# Patient Record
Sex: Male | Born: 1940 | Race: Black or African American | Hispanic: No | Marital: Married | Smoking: Current every day smoker
Health system: Southern US, Community
[De-identification: ages and names within clinical notes are randomized; demographics above are authoritative.]

## PROBLEM LIST (undated history)

## (undated) DIAGNOSIS — I509 Heart failure, unspecified: Secondary | ICD-10-CM

## (undated) DIAGNOSIS — I10 Essential (primary) hypertension: Secondary | ICD-10-CM

## (undated) DIAGNOSIS — I2510 Atherosclerotic heart disease of native coronary artery without angina pectoris: Secondary | ICD-10-CM

## (undated) DIAGNOSIS — N289 Disorder of kidney and ureter, unspecified: Secondary | ICD-10-CM

## (undated) HISTORY — PX: HEART TRANSPLANT: SHX268

---

## 2004-10-09 ENCOUNTER — Inpatient Hospital Stay (HOSPITAL_COMMUNITY): Admission: EM | Admit: 2004-10-09 | Discharge: 2004-10-16 | Payer: Self-pay | Admitting: Unknown Physician Specialty

## 2004-10-09 ENCOUNTER — Ambulatory Visit: Payer: Self-pay | Admitting: Internal Medicine

## 2004-10-09 ENCOUNTER — Ambulatory Visit: Payer: Self-pay | Admitting: Gastroenterology

## 2004-10-12 ENCOUNTER — Encounter (INDEPENDENT_AMBULATORY_CARE_PROVIDER_SITE_OTHER): Payer: Self-pay | Admitting: Cardiology

## 2005-05-16 ENCOUNTER — Encounter (HOSPITAL_COMMUNITY): Admission: RE | Admit: 2005-05-16 | Discharge: 2005-08-14 | Payer: Self-pay | Admitting: Cardiology

## 2005-08-15 ENCOUNTER — Encounter (HOSPITAL_COMMUNITY): Admission: RE | Admit: 2005-08-15 | Discharge: 2005-09-20 | Payer: Self-pay | Admitting: Cardiology

## 2007-05-08 ENCOUNTER — Emergency Department (HOSPITAL_COMMUNITY): Admission: EM | Admit: 2007-05-08 | Discharge: 2007-05-08 | Payer: Self-pay | Admitting: Emergency Medicine

## 2008-06-03 ENCOUNTER — Emergency Department (HOSPITAL_COMMUNITY): Admission: EM | Admit: 2008-06-03 | Discharge: 2008-06-03 | Payer: Self-pay | Admitting: Emergency Medicine

## 2010-05-31 ENCOUNTER — Encounter: Admission: RE | Admit: 2010-05-31 | Discharge: 2010-05-31 | Payer: Self-pay | Admitting: Nephrology

## 2010-06-06 ENCOUNTER — Encounter (HOSPITAL_COMMUNITY): Admission: RE | Admit: 2010-06-06 | Discharge: 2010-06-14 | Payer: Self-pay | Admitting: Nephrology

## 2010-06-20 ENCOUNTER — Encounter
Admission: RE | Admit: 2010-06-20 | Discharge: 2010-09-18 | Payer: Self-pay | Source: Home / Self Care | Attending: Nephrology | Admitting: Nephrology

## 2010-09-19 ENCOUNTER — Encounter (HOSPITAL_COMMUNITY)
Admission: RE | Admit: 2010-09-19 | Discharge: 2010-10-23 | Payer: Self-pay | Source: Home / Self Care | Attending: Nephrology | Admitting: Nephrology

## 2010-10-08 LAB — IRON AND TIBC
Iron: 112 ug/dL (ref 42–135)
Saturation Ratios: 48 % (ref 20–55)
TIBC: 234 ug/dL (ref 215–435)
UIBC: 122 ug/dL

## 2010-10-08 LAB — RENAL FUNCTION PANEL
Albumin: 3.3 g/dL — ABNORMAL LOW (ref 3.5–5.2)
BUN: 27 mg/dL — ABNORMAL HIGH (ref 6–23)
CO2: 23 mEq/L (ref 19–32)
Calcium: 9.8 mg/dL (ref 8.4–10.5)
Chloride: 105 mEq/L (ref 96–112)
Creatinine, Ser: 2.24 mg/dL — ABNORMAL HIGH (ref 0.4–1.5)
GFR calc Af Amer: 35 mL/min — ABNORMAL LOW (ref >60)
GFR calc non Af Amer: 29 mL/min — ABNORMAL LOW (ref >60)
Glucose, Bld: 96 mg/dL (ref 70–99)
Phosphorus: 2.9 mg/dL (ref 2.3–4.6)
Potassium: 4.7 mEq/L (ref 3.5–5.1)
Sodium: 137 mEq/L (ref 135–145)

## 2010-10-08 LAB — POCT HEMOGLOBIN-HEMACUE: Hemoglobin: 10.8 g/dL — ABNORMAL LOW (ref 13.0–17.0)

## 2010-10-08 LAB — FERRITIN: Ferritin: 1364 ng/mL — ABNORMAL HIGH (ref 22–322)

## 2010-10-18 LAB — POCT HEMOGLOBIN-HEMACUE: Hemoglobin: 10.8 g/dL — ABNORMAL LOW (ref 13.0–17.0)

## 2010-10-31 ENCOUNTER — Other Ambulatory Visit: Payer: Self-pay | Admitting: Nephrology

## 2010-10-31 ENCOUNTER — Encounter (HOSPITAL_COMMUNITY): Payer: Medicare Other | Attending: Nephrology

## 2010-10-31 ENCOUNTER — Other Ambulatory Visit: Payer: Self-pay

## 2010-10-31 DIAGNOSIS — N183 Chronic kidney disease, stage 3 unspecified: Secondary | ICD-10-CM | POA: Insufficient documentation

## 2010-10-31 DIAGNOSIS — D638 Anemia in other chronic diseases classified elsewhere: Secondary | ICD-10-CM | POA: Insufficient documentation

## 2010-10-31 LAB — RENAL FUNCTION PANEL
Albumin: 3.7 g/dL (ref 3.5–5.2)
BUN: 29 mg/dL — ABNORMAL HIGH (ref 6–23)
CO2: 24 mEq/L (ref 19–32)
Calcium: 10.2 mg/dL (ref 8.4–10.5)
Chloride: 105 mEq/L (ref 96–112)
Creatinine, Ser: 2.45 mg/dL — ABNORMAL HIGH (ref 0.4–1.5)
GFR calc Af Amer: 32 mL/min — ABNORMAL LOW (ref >60)
GFR calc non Af Amer: 26 mL/min — ABNORMAL LOW (ref >60)
Glucose, Bld: 98 mg/dL (ref 70–99)
Phosphorus: 3.8 mg/dL (ref 2.3–4.6)
Potassium: 4.9 mEq/L (ref 3.5–5.1)
Sodium: 137 mEq/L (ref 135–145)

## 2010-11-01 LAB — POCT HEMOGLOBIN-HEMACUE: Hemoglobin: 11.8 g/dL — ABNORMAL LOW (ref 13.0–17.0)

## 2010-11-14 ENCOUNTER — Other Ambulatory Visit: Payer: Self-pay | Admitting: Nephrology

## 2010-11-14 ENCOUNTER — Other Ambulatory Visit: Payer: Self-pay

## 2010-11-14 ENCOUNTER — Encounter (HOSPITAL_COMMUNITY): Payer: Medicare Other

## 2010-11-14 LAB — IRON AND TIBC
Iron: 42 ug/dL (ref 42–135)
Saturation Ratios: 22 % (ref 20–55)
TIBC: 187 ug/dL — ABNORMAL LOW (ref 215–435)
UIBC: 145 ug/dL

## 2010-11-14 LAB — FERRITIN: Ferritin: 1686 ng/mL — ABNORMAL HIGH (ref 22–322)

## 2010-11-15 LAB — POCT HEMOGLOBIN-HEMACUE: Hemoglobin: 10.9 g/dL — ABNORMAL LOW (ref 13.0–17.0)

## 2010-11-28 ENCOUNTER — Encounter (HOSPITAL_COMMUNITY): Payer: Medicare Other | Attending: Nephrology

## 2010-11-28 ENCOUNTER — Other Ambulatory Visit: Payer: Self-pay

## 2010-11-28 ENCOUNTER — Other Ambulatory Visit: Payer: Self-pay | Admitting: Nephrology

## 2010-11-28 DIAGNOSIS — D638 Anemia in other chronic diseases classified elsewhere: Secondary | ICD-10-CM | POA: Insufficient documentation

## 2010-11-28 DIAGNOSIS — N183 Chronic kidney disease, stage 3 unspecified: Secondary | ICD-10-CM | POA: Insufficient documentation

## 2010-11-28 LAB — RENAL FUNCTION PANEL
Albumin: 3.5 g/dL (ref 3.5–5.2)
BUN: 24 mg/dL — ABNORMAL HIGH (ref 6–23)
CO2: 22 mEq/L (ref 19–32)
Calcium: 9.4 mg/dL (ref 8.4–10.5)
Chloride: 105 mEq/L (ref 96–112)
Creatinine, Ser: 2.17 mg/dL — ABNORMAL HIGH (ref 0.4–1.5)
GFR calc Af Amer: 36 mL/min — ABNORMAL LOW (ref >60)
GFR calc non Af Amer: 30 mL/min — ABNORMAL LOW (ref >60)
Glucose, Bld: 122 mg/dL — ABNORMAL HIGH (ref 70–99)
Phosphorus: 3.2 mg/dL (ref 2.3–4.6)
Potassium: 4 mEq/L (ref 3.5–5.1)
Sodium: 133 mEq/L — ABNORMAL LOW (ref 135–145)

## 2010-12-03 LAB — RENAL FUNCTION PANEL
BUN: 28 mg/dL — ABNORMAL HIGH (ref 6–23)
Calcium: 10.1 mg/dL (ref 8.4–10.5)
Glucose, Bld: 143 mg/dL — ABNORMAL HIGH (ref 70–99)
Phosphorus: 3.6 mg/dL (ref 2.3–4.6)
Potassium: 4.9 mEq/L (ref 3.5–5.1)

## 2010-12-03 LAB — POCT HEMOGLOBIN-HEMACUE
Hemoglobin: 12.2 g/dL — ABNORMAL LOW (ref 13.0–17.0)
Hemoglobin: 12 g/dL — ABNORMAL LOW (ref 13.0–17.0)

## 2010-12-03 LAB — IRON AND TIBC
Iron: 36 ug/dL — ABNORMAL LOW (ref 42–135)
Saturation Ratios: 17 % — ABNORMAL LOW (ref 20–55)
UIBC: 174 ug/dL

## 2010-12-04 LAB — POCT HEMOGLOBIN-HEMACUE
Hemoglobin: 10.3 g/dL — ABNORMAL LOW (ref 13.0–17.0)
Hemoglobin: 12 g/dL — ABNORMAL LOW (ref 13.0–17.0)
Hemoglobin: 12.6 g/dL — ABNORMAL LOW (ref 13.0–17.0)

## 2010-12-05 LAB — POCT HEMOGLOBIN-HEMACUE
Hemoglobin: 9.3 g/dL — ABNORMAL LOW (ref 13.0–17.0)
Hemoglobin: 9.8 g/dL — ABNORMAL LOW (ref 13.0–17.0)

## 2010-12-06 LAB — POCT HEMOGLOBIN-HEMACUE: Hemoglobin: 3.7 g/dL — CL (ref 13.0–17.0)

## 2010-12-12 ENCOUNTER — Other Ambulatory Visit: Payer: Self-pay | Admitting: Nephrology

## 2010-12-12 ENCOUNTER — Encounter (HOSPITAL_COMMUNITY)
Admission: RE | Admit: 2010-12-12 | Discharge: 2010-12-12 | Payer: Medicare Other | Source: Ambulatory Visit | Attending: Nephrology | Admitting: Nephrology

## 2010-12-12 LAB — IRON AND TIBC
Iron: 46 ug/dL (ref 42–135)
Saturation Ratios: 25 % (ref 20–55)
TIBC: 181 ug/dL — ABNORMAL LOW (ref 215–435)
UIBC: 135 ug/dL

## 2010-12-26 ENCOUNTER — Encounter (HOSPITAL_COMMUNITY): Payer: Medicare Other | Attending: Nephrology

## 2010-12-26 ENCOUNTER — Other Ambulatory Visit: Payer: Self-pay | Admitting: Nephrology

## 2010-12-26 DIAGNOSIS — D638 Anemia in other chronic diseases classified elsewhere: Secondary | ICD-10-CM | POA: Insufficient documentation

## 2010-12-26 DIAGNOSIS — N183 Chronic kidney disease, stage 3 unspecified: Secondary | ICD-10-CM | POA: Insufficient documentation

## 2010-12-26 LAB — POCT HEMOGLOBIN-HEMACUE: Hemoglobin: 9.5 g/dL — ABNORMAL LOW (ref 13.0–17.0)

## 2011-01-09 ENCOUNTER — Other Ambulatory Visit: Payer: Self-pay | Admitting: Nephrology

## 2011-01-09 ENCOUNTER — Encounter (HOSPITAL_COMMUNITY): Payer: Medicare Other

## 2011-01-09 LAB — RENAL FUNCTION PANEL
BUN: 28 mg/dL — ABNORMAL HIGH (ref 6–23)
CO2: 25 mEq/L (ref 19–32)
Chloride: 103 mEq/L (ref 96–112)
Creatinine, Ser: 2.41 mg/dL — ABNORMAL HIGH (ref 0.4–1.5)
Glucose, Bld: 94 mg/dL (ref 70–99)

## 2011-01-09 LAB — IRON AND TIBC
Iron: 38 ug/dL — ABNORMAL LOW (ref 42–135)
Saturation Ratios: 18 % — ABNORMAL LOW (ref 20–55)
TIBC: 210 ug/dL — ABNORMAL LOW (ref 215–435)
UIBC: 172 ug/dL

## 2011-01-09 LAB — FERRITIN: Ferritin: 1475 ng/mL — ABNORMAL HIGH (ref 22–322)

## 2011-01-23 ENCOUNTER — Encounter (HOSPITAL_COMMUNITY)
Admission: RE | Admit: 2011-01-23 | Discharge: 2011-01-23 | Disposition: A | Discharge status: Home / Self Care | Payer: Medicare Other | Source: Ambulatory Visit | Attending: Nephrology | Admitting: Nephrology

## 2011-01-23 ENCOUNTER — Other Ambulatory Visit: Payer: Self-pay | Admitting: Nephrology

## 2011-01-23 DIAGNOSIS — N183 Chronic kidney disease, stage 3 unspecified: Secondary | ICD-10-CM | POA: Insufficient documentation

## 2011-01-23 DIAGNOSIS — D638 Anemia in other chronic diseases classified elsewhere: Secondary | ICD-10-CM | POA: Insufficient documentation

## 2011-01-23 LAB — POCT HEMOGLOBIN-HEMACUE: Hemoglobin: 9.8 g/dL — ABNORMAL LOW (ref 13.0–17.0)

## 2011-02-06 ENCOUNTER — Encounter (HOSPITAL_COMMUNITY): Payer: Medicare Other

## 2011-02-06 ENCOUNTER — Other Ambulatory Visit: Payer: Self-pay | Admitting: Nephrology

## 2011-02-06 LAB — IRON AND TIBC: UIBC: 152 ug/dL

## 2011-02-06 LAB — RENAL FUNCTION PANEL
BUN: 24 mg/dL — ABNORMAL HIGH (ref 6–23)
CO2: 25 mEq/L (ref 19–32)
Calcium: 9.8 mg/dL (ref 8.4–10.5)
Glucose, Bld: 95 mg/dL (ref 70–99)
Phosphorus: 3.2 mg/dL (ref 2.3–4.6)

## 2011-02-06 LAB — FERRITIN: Ferritin: 1566 ng/mL — ABNORMAL HIGH (ref 22–322)

## 2011-02-08 NOTE — Op Note (Signed)
[name], [name]                 ACCOUNT NO.:  [id number]   MEDICAL RECORD NO.:  [id number]          PATIENT TYPE:  INP   LOCATION:  2316                         FACILITY:  [facility]   PHYSICIAN:  Judie Petit, M.D. DATE OF BIRTH:  05-17-1941   DATE OF PROCEDURE:  10/09/2004  DATE OF DISCHARGE:                                 OPERATIVE REPORT   PROCEDURE:  Intubation.   ANESTHESIA:  Judie Petit, M.D.   The anesthesia team was called to 2316 in the SICU for this 70 year old male  cardiac patient in respiratory distress.  Case was briefly discussed with  Dr. Elsie Lincoln.  On arrival to the room, systolic blood pressure 80s to 90s, O2  saturation low 90s.  The patient was in respiratory distress.   Rapid sequence induction with cricoid pressure was performed.  Succinylcholine 100 mg and etomidate 10 mg were given intravenously via the  femoral line.  A #8 endotracheal tube was placed by CRNA without difficulty.  Positive end-tidal CO2 by Sallyanne Kuster.  Bilateral breath sounds.  The patient  tolerated the procedure okay.  Systolic blood pressure 90s afterwards, and  O2 saturation 98%.  The tube was secured by RT.   PLAN:  1.  Chest x-ray pending.  2.  Respiratory care per the cardiac team.       CE/MEDQ  D:  10/09/2004  T:  10/09/2004  Job:  [id number]

## 2011-02-08 NOTE — Discharge Summary (Signed)
NAMEGARRETT, Boone                 ACCOUNT NO.:  [id number]   MEDICAL RECORD NO.:  [id number]          PATIENT TYPE:  INP   LOCATION:  2316                         FACILITY:  [facility]   PHYSICIAN:  Richard A. Alanda Amass, M.D.DATE OF BIRTH:  January 17, 1941   DATE OF ADMISSION:  10/09/2004  DATE OF DISCHARGE:  10/16/2004                                 DISCHARGE SUMMARY   [name] is a 70 year old African-American, married male patient with  no prior history of coronary artery disease.  He presented to the emergency  room after being seen at Urgent Care with complaints of severe abdominal  pain, bloating, and sweating and a rapid heart rate.  At Urgent Care, he  developed some mild left-sided chest pain.  An EKG was done that revealed  anterior ST elevation, and he was transferred to [facility] Emergency  Department.  Dr. Elsie Lincoln assessed the patient.  He had ST elevation in V2 to  V3 and ST depression and T wave inversion in his inferior leads.  He was  seen by Dr. Elsie Lincoln, and it was decided to take him emergently to the  catheterization lab.  Note:  He had no prior history of coronary artery  disease.  He had been relatively healthy.  He moved down to [place]  about two to three years ago.  He lived in [place].  He did have high blood  pressure but was not on any medication for this.  He had some arthritis of  his left shoulder and pneumonia in the past.   FAMILY HISTORY:  Includes lung cancer.   SOCIAL HISTORY:  He did smoke about one pack per day since age of 65.  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  He was previously drinking two to three  scotch's per week.  He is married with three children and two step children.   He started having severe abdominal pain at about 11 a.m.  He arrived to  Urgent Care about 1:30 p.m.  Her arrived to the emergency room at 2:52 p.m.,  and he arrived in the catheterization lab at 3:10 p.m.  Cardiac  catheterization was performed by Dr.  Elsie Lincoln.  The patient was found to have  three-vessel severe disease.  He was in cardiogenic shock.  It was decided  not to do intervention at that time.  He had a 95% non-occlusive with  thrombus and plaque proximal LAD.  He had a 75% proximal OM-1.  He had a 75%  left circumflex, and he had a 100% totally occluded recanalized OM-2.  He  had a totalled RCA with some left-to-right collaterals.  He had 3 to 4+ MR.  Because of cardiogenic shock, anterior MI, an intra-aortic balloon pump was  placed.  He was also intubated on October 09, 2004.   He only had trivial urine output post cardiac catheterization.  He was given  IV Lasix 200 mg.  An NG tube was also placed.  He did have some emesis, and  they questioned aspiration.  He was given some albumin with a blood pressure  that dropped down to the 40s, and his heart rate was in the 130s which he  responded to.   Post catheterization, he was on IV Integrilin, dopamine which was turned  off, and then he was started on Levophed.  He also had IV heparin infusing.  He was seen by pulmonary critical care service for ventilator management.  He was sedated.  He developed a fever, and blood cultures were drawn.  Throughout his course of hospitalization, multiple blood cultures and urine  cultures were obtained which have all been negative.  On October 10, 2004,  he was started on Zosyn 3.375 mg IV every 6 hours.   CK-MBs peaked at an exceedingly high rate at 15,083, and CK-MB was 1024.  Troponin was greater than 100.  CVTS consult was called on October 10, 2004.  He was seen by Dr. Charlett Lango who thought the patient was not a  candidate for surgery at the present time but thought he would eventually  need bypass grafting if he stabilized and survived his massive event.   On October 11, 2004, hemoglobin was low.  He dropped down to 8.8, hematocrit  24.5; thus he was infused with 2 units of packed red blood cells.  His IABP  was at 1:1, then  started to be weaned on October 12, 2004, and he went from  1:1 to 1:3.  It was decided to continue him on the ventilator secondary to  his continue low pressures and need for volume with IV fluids and need for  IV Levophed.   On October 13, 2004, his IAPB was removed.  He again received 2 units of  packed red blood cells.  On October 13, 2004, they did try to wean the  respirator.  This failed.  His Levophed continued to be titrated for blood  pressure.  His blood pressure off the balloon pump ranged in the 80s  systolic, and diastolic was 40 to 60s.   On October 14, 2004, Integrilin was discontinued.  He then received 2 units  of packed red blood cells.  Ernestine Conrad was discontinued, and on October 15, 2004,  Lanoxin was started.  He continued to limp along without any real progress.  He still needed IV fluids and pressure support with IV Levophed and  intubation.  He did have some bloody emesis and some maroon stools.  Thus,  he had some iced lavage on October 15, 2004.  A GI consult was called on  October 16, 2004, and he underwent EGD.  This showed no active source of  bleeding.  He did have several sites of gastritis thought to be related to  NG tube trauma.  He also had some duodenitis.   He was seen on October 16, 2004, by Dr. Alanda Amass who conferred with Dr.  Elsie Lincoln.  It was decided that, since he was not improving, he may be a  candidate for transfer if he was not able to undergo bypass grafting.  Thus  he was seen by Dr. Dorris Fetch again who thought options would be an IAPB;  however, this was not the optimal option.  He thought that he should  consider left ventricular assist device as a bridge to recovery or  transplant if he was a candidate.  Dr. Dorris Fetch contacted Dr. Lucy Antigua lo  and Dr. Romona Curls at [place]. They were willing to take the patient in transfer.  Thus he will be transferred to [facility] for further treatment.  At the time of dictation,  his IV fluids are IV  Levophed at 35 drops or 18.7  mcg, IV Versed at 10 mg, IV fentanyl at 75 mcg, IV heparin at 11.3 drops.  His settings are PR VC at 12, no PEEP, 600 tidal volume, and 40% O2,.  To  note, he has had good output with maintenance of his renal function.  He has  been putting out anywhere from 1500 to 2000, 3000 mL of urine per day;  however, his I's and O's add up that he is positive for at least 11,000 mL  of fluid since October 09, 2004.  He has received so far 8 units of packed  red blood cells.  He did receive another 2 units on October 16, 2004.  He  has intermittently received some IV Lasix, and he has intermittently  received Lopressor.  He received 1 dose of Altace 2.5 mg through his NG.   LABORATORY DATA AND OTHER STUDIES:  October 16, 2004, hemoglobin 8.8,  hematocrit 26.3, WBC 21.6, platelets 140.  Sodium 141, potassium 4.1,  chloride 111, CO2 24, BUN 20, creatinine 1.2, glucose 132.  On October 10, 2004, AST was 44, ALT 175.  On October 15, 2004, total protein was 5.4.  His  albumin was 2.3.  Calcium was 8.  On October 15, 2004, his AST was 2; his  ALT was 56.  Hemoglobin A1C was 6.6.  CK-MB #1 was 29562, MB 1024.  His  troponin was greater than 100; #2 was 13086, MB 933, troponin greater than  100; #3 10742, MB 356, troponin greater than 100; #4 4838, MB 87, troponin  greater than 100.  October 09, 2004, total cholesterol was 208.  His HDL was  35.  His LDL was 152.  TSH was 2.413 on October 09, 2004.  On October 11, 2004, digoxin level was 0.8.  On October 15, 2004, digoxin level was 0.4.  Cortisol level was 34.1 on January 18, 206.  He had multiple blood cultures.  On October 10, 2004; on October 11, 2004; and on October 14, 2004, which  were all negative for any growth.  October 09, 2004, he had a urine culture.  On October 10, 2004, he had a urine culture, and on October 12, 2004, he had  a urine culture.  These were all negative also.  He had a sputum culture  that was not  pathogenic, oropharyngeal type flora.   Chest x-ray were also worse.  Chest x-rays were also done multiple times,  the last one being October 16, 2004, which showed further worsening of air  space pulmonary edema.   DISCHARGE DIAGNOSES:  1.  Acute anterior myocardial infarction complicated by prolonged      cardiogenic shock.  2.  Ventilator-dependent respiratory failure secondary to cardiogenic shock.  3.  Blood loss anemia.  Received multiple transfusions.  Underwent      esophagogastroduodenoscopy  on October 16, 2004, showing no acute areas      of bleed.  He did have areas of gastritis and duodenitis.  Gastritis was      thought to be due to nasogastric tube trauma.  4.  Fever.  No source of bacteria found.  He has been on Zosyn intravenously      since October 10, 2004, started at 3.375 mg every 6 hours, increased to      4.5 g every 6 hours.  5.  Three to four plus myocardial infarction per cardiac catheterization.  He did undergo echocardiogram on October 12, 2004, showing a normal      mitral valve structure.  There was mild mitral valve regurgitation.  6.  Ischemic cardiomyopathy with ejection fraction between 15 and 20% with      diffuse hypokinesis of the mid to distal anterior wall, akinesis of the      mid to distal anterolateral wall and periapical wall.  7.  Hypoglycemia, being given intermittent insulin.  8.  Hyperlipidemia with an LDL of 152 on admission.  9.  Questionable aspiration pneumonia.  10. Worsening pulmonary edema by chest x-ray on October 16, 2004.  11. Probable hypertension as an outpatient, untreated.  12. Osteoarthritis.   SCHEDULED MEDICATIONS:  1.  Xopenex 0.63 mg hand-held nebulizer every 6 hours.  2.  Atrovent 0.5 mg every 6 hours.  3.  Chlorhexidine liquid 15 mL every 12 hours.  4.  Lanoxin 0.125 mg every day IV.  5.  Zosyn 4.5 g IV every 6 hours.  6.  Lasix intermittently.  On October 16, 2004, he received 20 mg IV in the      morning and  then 40 mg between packed red blood cells.  7.  Altace 2.5 mg b.i.d. which had not been given except 1 time.  8.  Potassium chloride 40 mEq per tube.  He received that on October 15, 2004.  9.  Lopressor suspension 12.5 mg every 8 hours which he has been receiving      intermittently, last dose given on January 24 at 6 a.m.  10. IV Protonix 40 mg b.i.d.  11. IV Levophed 18 mcg 35 drops.  12. IV Versed at 10 drops.  13. Fentanyl in normal saline 15/10 at 75 mcg.  14. IV Heparin at 11.3 drops.   As far as his mental status is concerned, he has been with moderate to heavy  sedation on October 16, 2004, but in the past when his sedation has been  eased up, he has been appropriate.      BB/MEDQ  D:  10/16/2004  T:  10/16/2004  Job:  [id number]   cc:   Salvatore Decent. Dorris Fetch, M.D.  [address]  [place]  [place] [address]   Madaline Savage, M.D.  [phone] [address], [address]  [place]  [place] [address]  Fax: [phone]   Oley Balm. Sung Amabile, M.D. [facility]   Molly Maduro D. Arlyce Dice, M.D. [facility]   Knox Royalty, M.D.   Floor for transfer to [facility].

## 2011-02-08 NOTE — Cardiovascular Report (Signed)
[name], [name]                 ACCOUNT NO.:  [id number]   MEDICAL RECORD NO.:  [id number]           PATIENT TYPE:   LOCATION:                                 FACILITY:   PHYSICIAN:  Madaline Savage, M.D.     DATE OF BIRTH:   DATE OF PROCEDURE:  10/09/2004  DATE OF DISCHARGE:                              CARDIAC CATHETERIZATION   PATIENT PROFILE:  The patient is a 70 year old African-American married male  who presented to an urgent care center on the day of admission with severe  abdominal pain, bloating, and sweating along with rapid heart rate.  The  patient presented to an urgent care center and Dr. Knox Royalty evaluated  the patient and referred him to the emergency room at [facility] after an EKG  revealed abnormal ST-segment elevation.  The patient had a history arthritis  of the shoulder and an abnormal blood pressure in the past, but never  treated.  He also had a history of pneumonia.  He had been a smoker of one  pack of cigarettes a day since age 22 and he is 70 years old at the time of  presentation.  The patient was examined and assessed and felt to be in need  of emergency cardiac catheterization.  I talked to the patient's wife.  I  examined the patient and then we went to the catheterization laboratory  emergently.  At the time of transfer the patient's blood pressure was in his  80s his heart rate was approximately 122 and appeared to be sinus.   CATHETERIZATION LABORATORY FINDINGS:  Left main coronary artery was  unremarkable.  Left anterior descending coronary artery contained a non-  occlusive stenosis in the proximal LAD with both plaque and non-occlusive  thrombus.  There was a diagonal distal to this vessel which was small as  well as a small distal LAD.   The left circumflex coronary artery gave rise to an obtuse marginal branch  arising very proximally from the LAD that had a 75% stenosis proximally.  There were two other stenoses in the circumflex.  One  was in the ongoing  circumflex as it coursed through the atrioventricular groove very  proximally.  In the proximal to mid portion of this portion of the  circumflex there was a 75% stenosis.  There was a recanalized total  occlusion of an obtuse marginal branch #2.  The RCA was 100% occluded  proximally.  There was some recanalization and some distal flow into the RCA  by way of a pulmonary conus branch into the distal RCA.  There was also  collateralization of the distal RCA from the distal circumflex.  All vessels  viewed were felt to be very poor candidates for bypass grafting.  Left  ventricular angiography showed 3-4+ mitral regurgitation and showed severe  anterolateral wall hypokinesis.  Ejection fraction was felt to be 30-40%.  The patient's dye load was 105 mL of Omnipaque.   FINAL DIAGNOSIS:  1.  Acute myocardial infarction with ST-segment elevation in V1-V4 with      right bundle branch  block.  Three-vessel coronary artery disease,      severe, with poor arterial run-off making coronary bypass grafting an      unpopular option.  2.  Ischemic cardiomyopathy with what appears to be anterolateral wall      hypokinesis of severe degree, probably acute.  3.  Moderate to severe mitral regurgitation   ADDENDUM:  The patient also had a flush shot performed in his left  subclavian which was patent and his internal mammary artery which was  patent.  He also had abdominal aortography performed with a small of dye to  study the configuration of the abdominal aorta before intra-aortic balloon  pumping.  There was no renal artery stenosis and the abdominal aorta  appeared reasonably normal.           ______________________________  Madaline Savage, M.D.     WHG/MEDQ  D:  02/19/2006  T:  02/19/2006  Job:  [id number]

## 2011-02-08 NOTE — Consult Note (Signed)
[name], Boone                 ACCOUNT NO.:  [id number]   MEDICAL RECORD NO.:  [id number]          PATIENT TYPE:  INP   LOCATION:  2316                         FACILITY:  [facility]   PHYSICIAN:  Salvatore Decent. Dorris Fetch, M.D.DATE OF BIRTH:  August 01, 1941   DATE OF CONSULTATION:  10/10/2004  DATE OF DISCHARGE:                                   CONSULTATION   REASON FOR CONSULTATION:  Severe three-vessel disease, status post  myocardial infarction.   HISTORY OF PRESENT ILLNESS:  [name] is a 70 year old gentleman with a  history of tobacco abuse but no previous cardiac history.  He was in his  usual state of health until 11 a.m. yesterday when he developed severe  abdominal pain and bloating, also became diaphoretic and had a rapid  heartbeat.  He went to urgent care and there began complaining of left-sided  chest pain.  He had ST elevation on his EKG and was transferred to [facility] Emergency Department.  Dr. Elsie Lincoln saw and evaluated the patient and  took him to the catheterization laboratory where he was found to have severe  three-vessel coronary disease.  His ejection fraction was approximately 30%  with anterior severe hypo- versus akinesis.  He had 3 to 4+ mitral  regurgitation.  He was in shock and required intra-aortic balloon pump  placement and he subsequently ruled in for a myocardial infarction with a  CPK of greater than 15,000 and MB greater than 1000 and a troponin of  greater than 100.  He did require intubation for acute respiratory failure  secondary to pulmonary edema.  The patient currently is intubated and  sedated.   PAST MEDICAL HISTORY:  His past medical history is significant for tobacco  abuse, COPD, shoulder arthritis, history of pneumonia, hypertension,  hypercholesterolemia.   MEDICATIONS ON ADMISSION:  Celebrex 200 mg by mouth daily.   ALLERGIES:  No known drug allergies.   FAMILY HISTORY:  Father had lung cancer, no history of coronary disease.   SOCIAL HISTORY:  He is retired from [employer].  He has smoked one pack a day  since age 89.  He drinks occasionally socially but has had none in two or  three months.  He is married with three children and two stepchildren.   REVIEW OF SYSTEMS:  The patient is intubated and unable to provide.  His  wife stated that he had been in his usual state of health prior to this  event.   PHYSICAL EXAMINATION:  GENERAL:  [name] is a 70 year old African-American  male.  He is intubated and sedated.  VITAL SIGNS:  Blood pressure is 82/47, pulmonary artery pressure is 35/23,  pulse is 116.  His intra-aortic balloon pump is in place with 1:1.  Oxygen  saturations 100%.  HEENT:  Unremarkable.  NEUROLOGICAL:  He is sedated.  NECK:  There are no bruits.  CARDIAC:  He has regular rate and rhythm.  There was a murmur, somewhat  masked by balloon pump sounds.  ABDOMEN:  Soft.  LUNGS:  Clear anteriorly with no wheezing.  EXTREMITIES:  Warm.  There is no palpable dorsalis pedis or posterior tibial  pulses.  Skin is warm and dry.   LABORATORY DATA:  CPK, MBs, troponin as mentioned.  EKG showed severe ST  elevation in V2 and V3.  His cholesterol was 208, triglycerides 103.  HDL  was 35, LDL 152, BNP level was 38 on admission.  Chest x-ray showed  pulmonary edema.   IMPRESSION:  [name] is a 70 year old gentleman.  He presented yesterday  with an acute myocardial infarction and has had a massive event.  His  cardiac enzymes are markedly elevated, the highest I have seen in a patient  who has survived an MI.  He currently is requiring intra-aortic balloon pump  support.  He is not a candidate for surgery at the present time but given  his three-vessel disease and acute MI, would need bypass grafting if he can  be stabilized to the point that he will tolerate the operation.  I will  follow up with Dr. Elsie Lincoln and  we will reassess when surgery might feasible  but it likely would not be less than one week from  the acute event.       SCH/MEDQ  D:  10/10/2004  T:  10/10/2004  Job:  [id number]   cc:   Madaline Savage, M.D.  [address], [address]  [place]  [place] [address]  Fax: [phone]

## 2011-02-20 ENCOUNTER — Other Ambulatory Visit: Payer: Self-pay | Admitting: Nephrology

## 2011-02-20 ENCOUNTER — Encounter (HOSPITAL_COMMUNITY): Payer: Medicare Other

## 2011-02-21 LAB — POCT HEMOGLOBIN-HEMACUE: Hemoglobin: 9.6 g/dL — ABNORMAL LOW (ref 13.0–17.0)

## 2011-03-06 ENCOUNTER — Other Ambulatory Visit: Payer: Self-pay | Admitting: Nephrology

## 2011-03-06 ENCOUNTER — Encounter (HOSPITAL_COMMUNITY): Payer: Medicare Other | Attending: Nephrology

## 2011-03-06 DIAGNOSIS — D638 Anemia in other chronic diseases classified elsewhere: Secondary | ICD-10-CM | POA: Insufficient documentation

## 2011-03-06 DIAGNOSIS — N183 Chronic kidney disease, stage 3 unspecified: Secondary | ICD-10-CM | POA: Insufficient documentation

## 2011-03-06 LAB — RENAL FUNCTION PANEL
Albumin: 3.3 g/dL — ABNORMAL LOW (ref 3.5–5.2)
BUN: 29 mg/dL — ABNORMAL HIGH (ref 6–23)
Chloride: 106 mEq/L (ref 96–112)
Creatinine, Ser: 2.24 mg/dL — ABNORMAL HIGH (ref 0.4–1.5)
GFR calc non Af Amer: 29 mL/min — ABNORMAL LOW (ref >60)
Phosphorus: 3.1 mg/dL (ref 2.3–4.6)
Potassium: 4.3 mEq/L (ref 3.5–5.1)

## 2011-03-06 LAB — IRON AND TIBC
Iron: 44 ug/dL (ref 42–135)
TIBC: 197 ug/dL — ABNORMAL LOW (ref 215–435)

## 2011-03-06 LAB — FERRITIN: Ferritin: 1625 ng/mL — ABNORMAL HIGH (ref 22–322)

## 2011-03-07 LAB — POCT HEMOGLOBIN-HEMACUE: Hemoglobin: 10.3 g/dL — ABNORMAL LOW (ref 13.0–17.0)

## 2011-03-20 ENCOUNTER — Other Ambulatory Visit: Payer: Self-pay | Admitting: Nephrology

## 2011-03-20 ENCOUNTER — Encounter (HOSPITAL_COMMUNITY): Payer: Medicare Other

## 2011-04-03 ENCOUNTER — Encounter (HOSPITAL_COMMUNITY): Payer: Medicare Other | Attending: Nephrology

## 2011-04-03 ENCOUNTER — Other Ambulatory Visit: Payer: Self-pay | Admitting: Nephrology

## 2011-04-03 DIAGNOSIS — D638 Anemia in other chronic diseases classified elsewhere: Secondary | ICD-10-CM | POA: Insufficient documentation

## 2011-04-03 DIAGNOSIS — N183 Chronic kidney disease, stage 3 unspecified: Secondary | ICD-10-CM | POA: Insufficient documentation

## 2011-04-03 LAB — FERRITIN: Ferritin: 1503 ng/mL — ABNORMAL HIGH (ref 22–322)

## 2011-04-03 LAB — IRON AND TIBC
Iron: 42 ug/dL (ref 42–135)
UIBC: 157 ug/dL

## 2011-04-03 LAB — RENAL FUNCTION PANEL
Albumin: 3.3 g/dL — ABNORMAL LOW (ref 3.5–5.2)
Chloride: 101 mEq/L (ref 96–112)
GFR calc non Af Amer: 27 mL/min — ABNORMAL LOW (ref >60)
Phosphorus: 3.1 mg/dL (ref 2.3–4.6)
Potassium: 4.2 mEq/L (ref 3.5–5.1)

## 2011-04-04 LAB — POCT HEMOGLOBIN-HEMACUE: Hemoglobin: 10.2 g/dL — ABNORMAL LOW (ref 13.0–17.0)

## 2011-04-17 ENCOUNTER — Other Ambulatory Visit: Payer: Self-pay | Admitting: Nephrology

## 2011-04-17 ENCOUNTER — Encounter (HOSPITAL_COMMUNITY): Payer: Medicare Other

## 2011-05-01 ENCOUNTER — Other Ambulatory Visit: Payer: Self-pay | Admitting: Nephrology

## 2011-05-01 ENCOUNTER — Encounter (HOSPITAL_COMMUNITY): Payer: Medicare Other | Attending: Nephrology

## 2011-05-01 DIAGNOSIS — N183 Chronic kidney disease, stage 3 unspecified: Secondary | ICD-10-CM | POA: Insufficient documentation

## 2011-05-01 DIAGNOSIS — D638 Anemia in other chronic diseases classified elsewhere: Secondary | ICD-10-CM | POA: Insufficient documentation

## 2011-05-01 LAB — IRON AND TIBC
Saturation Ratios: 22 % (ref 20–55)
TIBC: 204 ug/dL — ABNORMAL LOW (ref 215–435)
UIBC: 159 ug/dL

## 2011-05-01 LAB — BASIC METABOLIC PANEL
BUN: 28 mg/dL — ABNORMAL HIGH (ref 6–23)
Chloride: 102 mEq/L (ref 96–112)
GFR calc Af Amer: 31 mL/min — ABNORMAL LOW (ref >60)
GFR calc non Af Amer: 26 mL/min — ABNORMAL LOW (ref >60)
Potassium: 4.2 mEq/L (ref 3.5–5.1)
Sodium: 136 mEq/L (ref 135–145)

## 2011-05-15 ENCOUNTER — Encounter (HOSPITAL_COMMUNITY): Payer: Medicare Other

## 2011-05-15 ENCOUNTER — Other Ambulatory Visit: Payer: Self-pay | Admitting: Nephrology

## 2011-05-15 LAB — POCT HEMOGLOBIN-HEMACUE: Hemoglobin: 10.2 g/dL — ABNORMAL LOW (ref 13.0–17.0)

## 2011-05-29 ENCOUNTER — Encounter (HOSPITAL_COMMUNITY): Payer: Medicare Other | Attending: Nephrology

## 2011-05-29 ENCOUNTER — Other Ambulatory Visit: Payer: Self-pay | Admitting: Nephrology

## 2011-05-29 DIAGNOSIS — N183 Chronic kidney disease, stage 3 unspecified: Secondary | ICD-10-CM | POA: Insufficient documentation

## 2011-05-29 DIAGNOSIS — D638 Anemia in other chronic diseases classified elsewhere: Secondary | ICD-10-CM | POA: Insufficient documentation

## 2011-05-29 LAB — RENAL FUNCTION PANEL
Albumin: 3.4 g/dL — ABNORMAL LOW (ref 3.5–5.2)
BUN: 22 mg/dL (ref 6–23)
Calcium: 9.6 mg/dL (ref 8.4–10.5)
Chloride: 104 mEq/L (ref 96–112)
Creatinine, Ser: 2.47 mg/dL — ABNORMAL HIGH (ref 0.50–1.35)

## 2011-05-29 LAB — IRON AND TIBC
Saturation Ratios: 15 % — ABNORMAL LOW (ref 20–55)
TIBC: 167 ug/dL — ABNORMAL LOW (ref 215–435)

## 2011-05-29 LAB — FERRITIN: Ferritin: 1181 ng/mL — ABNORMAL HIGH (ref 22–322)

## 2011-06-12 ENCOUNTER — Other Ambulatory Visit: Payer: Self-pay | Admitting: Nephrology

## 2011-06-12 ENCOUNTER — Encounter (HOSPITAL_COMMUNITY): Payer: Medicare Other

## 2011-06-12 LAB — POCT HEMOGLOBIN-HEMACUE: Hemoglobin: 10.6 g/dL — ABNORMAL LOW (ref 13.0–17.0)

## 2011-06-26 ENCOUNTER — Other Ambulatory Visit: Payer: Self-pay | Admitting: Nephrology

## 2011-06-26 ENCOUNTER — Encounter (HOSPITAL_COMMUNITY)
Admission: RE | Admit: 2011-06-26 | Discharge: 2011-06-26 | Disposition: A | Discharge status: Home / Self Care | Payer: Medicare Other | Source: Ambulatory Visit | Attending: Nephrology | Admitting: Nephrology

## 2011-06-26 DIAGNOSIS — D638 Anemia in other chronic diseases classified elsewhere: Secondary | ICD-10-CM | POA: Insufficient documentation

## 2011-06-26 DIAGNOSIS — N183 Chronic kidney disease, stage 3 unspecified: Secondary | ICD-10-CM | POA: Insufficient documentation

## 2011-06-26 LAB — RAPID URINE DRUG SCREEN, HOSP PERFORMED
Barbiturates: NOT DETECTED
Benzodiazepines: NOT DETECTED
Cocaine: NOT DETECTED
Opiates: NOT DETECTED

## 2011-06-26 LAB — DIFFERENTIAL
Eosinophils Absolute: 0.1
Lymphs Abs: 1.7
Monocytes Relative: 10
Neutro Abs: 8.3 — ABNORMAL HIGH
Neutrophils Relative %: 73

## 2011-06-26 LAB — PROTIME-INR
INR: 1.2
Prothrombin Time: 15.6 — ABNORMAL HIGH

## 2011-06-26 LAB — COMPREHENSIVE METABOLIC PANEL
ALT: 11
Albumin: 3.4 — ABNORMAL LOW
Calcium: 8.3 — ABNORMAL LOW
Glucose, Bld: 92
Sodium: 132 — ABNORMAL LOW
Total Protein: 7

## 2011-06-26 LAB — BASIC METABOLIC PANEL
CO2: 7 — CL
Calcium: 8.3 — ABNORMAL LOW
Creatinine, Ser: 13.17 — ABNORMAL HIGH
Glucose, Bld: 93

## 2011-06-26 LAB — POCT CARDIAC MARKERS
CKMB, poc: 7.5
Myoglobin, poc: 442
Troponin i, poc: <0.05

## 2011-06-26 LAB — URINALYSIS, ROUTINE W REFLEX MICROSCOPIC
Ketones, ur: NEGATIVE
Protein, ur: 100 — AB
Urobilinogen, UA: 0.2

## 2011-06-26 LAB — CK TOTAL AND CKMB (NOT AT ARMC)
CK, MB: 6.9 — ABNORMAL HIGH
Relative Index: 6.6 — ABNORMAL HIGH

## 2011-06-26 LAB — POCT I-STAT 3, ART BLOOD GAS (G3+)
O2 Saturation: 94
pCO2 arterial: 22.6 — ABNORMAL LOW
pH, Arterial: 7.099 — CL

## 2011-06-26 LAB — POCT I-STAT, CHEM 8
Hemoglobin: 13.9
Sodium: 141
TCO2: 25

## 2011-06-26 LAB — RENAL FUNCTION PANEL
CO2: 25 mEq/L (ref 19–32)
Chloride: 104 mEq/L (ref 96–112)
GFR calc Af Amer: 29 mL/min — ABNORMAL LOW (ref >90)
GFR calc non Af Amer: 25 mL/min — ABNORMAL LOW (ref >90)
Glucose, Bld: 97 mg/dL (ref 70–99)
Potassium: 4.2 mEq/L (ref 3.5–5.1)
Sodium: 139 mEq/L (ref 135–145)

## 2011-06-26 LAB — FERRITIN: Ferritin: 1186 ng/mL — ABNORMAL HIGH (ref 22–322)

## 2011-06-26 LAB — IRON AND TIBC
Iron: 43 ug/dL (ref 42–135)
Saturation Ratios: 22 % (ref 20–55)
TIBC: 195 ug/dL — ABNORMAL LOW (ref 215–435)

## 2011-06-26 LAB — URINE MICROSCOPIC-ADD ON

## 2011-06-26 LAB — TSH: TSH: 1.385

## 2011-06-26 LAB — CBC
Hemoglobin: 10.6 — ABNORMAL LOW
MCHC: 34.3
Platelets: 188
RDW: 15.4

## 2011-06-26 LAB — CROSSMATCH: ABO/RH(D): O NEG

## 2011-07-05 LAB — POCT I-STAT CREATININE
Creatinine, Ser: 4 — ABNORMAL HIGH
Operator id: 288831

## 2011-07-05 LAB — I-STAT 8, (EC8 V) (CONVERTED LAB)
BUN: 86 — ABNORMAL HIGH
Bicarbonate: 13.9 — ABNORMAL LOW
Chloride: 110
Glucose, Bld: 108 — ABNORMAL HIGH
pCO2, Ven: 28.7 — ABNORMAL LOW

## 2011-07-10 ENCOUNTER — Encounter (HOSPITAL_COMMUNITY): Payer: Medicare Other

## 2011-07-10 ENCOUNTER — Other Ambulatory Visit: Payer: Self-pay | Admitting: Nephrology

## 2011-07-10 LAB — POCT HEMOGLOBIN-HEMACUE: Hemoglobin: 11.9 g/dL — ABNORMAL LOW (ref 13.0–17.0)

## 2011-07-24 ENCOUNTER — Other Ambulatory Visit: Payer: Self-pay | Admitting: Nephrology

## 2011-07-24 ENCOUNTER — Encounter (HOSPITAL_COMMUNITY)
Admission: RE | Admit: 2011-07-24 | Discharge: 2011-07-24 | Disposition: A | Discharge status: Home / Self Care | Payer: Medicare Other | Source: Ambulatory Visit | Attending: Nephrology | Admitting: Nephrology

## 2011-07-24 DIAGNOSIS — N183 Chronic kidney disease, stage 3 unspecified: Secondary | ICD-10-CM | POA: Insufficient documentation

## 2011-07-24 DIAGNOSIS — D638 Anemia in other chronic diseases classified elsewhere: Secondary | ICD-10-CM | POA: Insufficient documentation

## 2011-07-24 LAB — RENAL FUNCTION PANEL
Albumin: 3.7 g/dL (ref 3.5–5.2)
Chloride: 98 mEq/L (ref 96–112)
GFR calc Af Amer: 26 mL/min — ABNORMAL LOW (ref >90)
Phosphorus: 3.8 mg/dL (ref 2.3–4.6)
Potassium: 4.9 mEq/L (ref 3.5–5.1)
Sodium: 134 mEq/L — ABNORMAL LOW (ref 135–145)

## 2011-07-24 LAB — IRON AND TIBC
Iron: 32 ug/dL — ABNORMAL LOW (ref 42–135)
TIBC: 178 ug/dL — ABNORMAL LOW (ref 215–435)

## 2011-07-24 LAB — POCT HEMOGLOBIN-HEMACUE: Hemoglobin: 11 g/dL — ABNORMAL LOW (ref 13.0–17.0)

## 2011-07-25 LAB — PTH, INTACT AND CALCIUM: Calcium, Total (PTH): 9.9 mg/dL (ref 8.4–10.5)

## 2011-08-05 ENCOUNTER — Other Ambulatory Visit (HOSPITAL_COMMUNITY): Payer: Self-pay | Admitting: *Deleted

## 2011-08-07 ENCOUNTER — Encounter (HOSPITAL_COMMUNITY)
Admission: RE | Admit: 2011-08-07 | Discharge: 2011-08-07 | Disposition: A | Discharge status: Home / Self Care | Payer: Medicare Other | Source: Ambulatory Visit | Attending: Nephrology | Admitting: Nephrology

## 2011-08-07 DIAGNOSIS — N183 Chronic kidney disease, stage 3 unspecified: Secondary | ICD-10-CM | POA: Insufficient documentation

## 2011-08-07 DIAGNOSIS — D638 Anemia in other chronic diseases classified elsewhere: Secondary | ICD-10-CM | POA: Insufficient documentation

## 2011-08-07 MED ORDER — EPOETIN ALFA 20000 UNIT/ML IJ SOLN
INTRAMUSCULAR | Status: AC
  Filled 2011-08-07: qty 1

## 2011-08-07 MED ORDER — EPOETIN ALFA 10000 UNIT/ML IJ SOLN
20000.0000 [IU] | INTRAMUSCULAR | Status: DC
  Administered 2011-08-07: 20000 [IU] via SUBCUTANEOUS

## 2011-08-21 ENCOUNTER — Encounter (HOSPITAL_COMMUNITY)
Admission: RE | Admit: 2011-08-21 | Discharge: 2011-08-21 | Disposition: A | Discharge status: Home / Self Care | Payer: Medicare Other | Source: Ambulatory Visit | Attending: Nephrology | Admitting: Nephrology

## 2011-08-21 LAB — RENAL FUNCTION PANEL
Albumin: 3.1 g/dL — ABNORMAL LOW (ref 3.5–5.2)
BUN: 29 mg/dL — ABNORMAL HIGH (ref 6–23)
CO2: 22 mEq/L (ref 19–32)
Chloride: 104 mEq/L (ref 96–112)
Creatinine, Ser: 2.63 mg/dL — ABNORMAL HIGH (ref 0.50–1.35)
Glucose, Bld: 107 mg/dL — ABNORMAL HIGH (ref 70–99)
Potassium: 4.5 mEq/L (ref 3.5–5.1)

## 2011-08-21 LAB — IRON AND TIBC
Iron: 44 ug/dL (ref 42–135)
Saturation Ratios: 26 % (ref 20–55)
TIBC: 167 ug/dL — ABNORMAL LOW (ref 215–435)
UIBC: 123 ug/dL — ABNORMAL LOW (ref 125–400)

## 2011-08-21 LAB — FERRITIN: Ferritin: 1372 ng/mL — ABNORMAL HIGH (ref 22–322)

## 2011-08-21 LAB — POCT HEMOGLOBIN-HEMACUE: Hemoglobin: 9.8 g/dL — ABNORMAL LOW (ref 13.0–17.0)

## 2011-08-21 MED ORDER — EPOETIN ALFA 10000 UNIT/ML IJ SOLN: 20000.0000 [IU] | INTRAMUSCULAR | Status: DC

## 2011-08-21 MED ORDER — EPOETIN ALFA 20000 UNIT/ML IJ SOLN
INTRAMUSCULAR | Status: AC
  Administered 2011-08-21: 13:00:00
  Filled 2011-08-21: qty 1

## 2011-08-23 ENCOUNTER — Other Ambulatory Visit: Payer: Self-pay | Admitting: Nephrology

## 2011-09-03 ENCOUNTER — Other Ambulatory Visit (HOSPITAL_COMMUNITY): Payer: Self-pay | Admitting: *Deleted

## 2011-09-04 ENCOUNTER — Encounter (HOSPITAL_COMMUNITY)
Admission: RE | Admit: 2011-09-04 | Discharge: 2011-09-04 | Disposition: A | Discharge status: Home / Self Care | Payer: Medicare Other | Source: Ambulatory Visit | Attending: Nephrology | Admitting: Nephrology

## 2011-09-04 DIAGNOSIS — N183 Chronic kidney disease, stage 3 unspecified: Secondary | ICD-10-CM | POA: Insufficient documentation

## 2011-09-04 DIAGNOSIS — D638 Anemia in other chronic diseases classified elsewhere: Secondary | ICD-10-CM | POA: Insufficient documentation

## 2011-09-04 MED ORDER — EPOETIN ALFA 10000 UNIT/ML IJ SOLN: 20000.0000 [IU] | INTRAMUSCULAR | Status: DC

## 2011-09-04 MED ORDER — EPOETIN ALFA 20000 UNIT/ML IJ SOLN
INTRAMUSCULAR | Status: AC
  Administered 2011-09-04: 20000 [IU] via SUBCUTANEOUS
  Filled 2011-09-04: qty 1

## 2011-09-18 ENCOUNTER — Encounter (HOSPITAL_COMMUNITY)
Admission: RE | Admit: 2011-09-18 | Discharge: 2011-09-18 | Disposition: A | Discharge status: Home / Self Care | Payer: Medicare Other | Source: Ambulatory Visit | Attending: Nephrology | Admitting: Nephrology

## 2011-09-18 LAB — RENAL FUNCTION PANEL
Albumin: 3.1 g/dL — ABNORMAL LOW (ref 3.5–5.2)
CO2: 20 mEq/L (ref 19–32)
Chloride: 103 mEq/L (ref 96–112)
GFR calc Af Amer: 25 mL/min — ABNORMAL LOW (ref >90)
GFR calc non Af Amer: 21 mL/min — ABNORMAL LOW (ref >90)
Potassium: 4.2 mEq/L (ref 3.5–5.1)
Sodium: 135 mEq/L (ref 135–145)

## 2011-09-18 LAB — IRON AND TIBC
Iron: 43 ug/dL (ref 42–135)
Saturation Ratios: 24 % (ref 20–55)
TIBC: 181 ug/dL — ABNORMAL LOW (ref 215–435)

## 2011-09-18 MED ORDER — EPOETIN ALFA 20000 UNIT/ML IJ SOLN
INTRAMUSCULAR | Status: AC
  Administered 2011-09-18: 20000 [IU] via SUBCUTANEOUS
  Filled 2011-09-18: qty 1

## 2011-09-18 MED ORDER — EPOETIN ALFA 10000 UNIT/ML IJ SOLN: 20000.0000 [IU] | INTRAMUSCULAR | Status: DC

## 2011-10-02 ENCOUNTER — Encounter (HOSPITAL_COMMUNITY)
Admission: RE | Admit: 2011-10-02 | Discharge: 2011-10-02 | Disposition: A | Discharge status: Home / Self Care | Payer: Medicare Other | Source: Ambulatory Visit | Attending: Nephrology | Admitting: Nephrology

## 2011-10-02 DIAGNOSIS — D638 Anemia in other chronic diseases classified elsewhere: Secondary | ICD-10-CM | POA: Insufficient documentation

## 2011-10-02 DIAGNOSIS — N183 Chronic kidney disease, stage 3 unspecified: Secondary | ICD-10-CM | POA: Insufficient documentation

## 2011-10-02 MED ORDER — EPOETIN ALFA 10000 UNIT/ML IJ SOLN: 20000.0000 [IU] | INTRAMUSCULAR | Status: DC

## 2011-10-02 MED ORDER — EPOETIN ALFA 20000 UNIT/ML IJ SOLN
INTRAMUSCULAR | Status: AC
  Administered 2011-10-02: 20000 [IU] via SUBCUTANEOUS
  Filled 2011-10-02: qty 1

## 2011-10-09 ENCOUNTER — Encounter (HOSPITAL_COMMUNITY)
Admission: RE | Admit: 2011-10-09 | Discharge: 2011-10-09 | Disposition: A | Discharge status: Home / Self Care | Payer: Medicare Other | Source: Ambulatory Visit | Attending: Nephrology | Admitting: Nephrology

## 2011-10-09 MED ORDER — EPOETIN ALFA 20000 UNIT/ML IJ SOLN
INTRAMUSCULAR | Status: AC
  Administered 2011-10-09: 20000 [IU] via SUBCUTANEOUS
  Filled 2011-10-09: qty 1

## 2011-10-09 MED ORDER — EPOETIN ALFA 10000 UNIT/ML IJ SOLN: 20000.0000 [IU] | INTRAMUSCULAR | Status: DC

## 2011-10-11 ENCOUNTER — Other Ambulatory Visit (HOSPITAL_COMMUNITY): Payer: Self-pay | Admitting: *Deleted

## 2011-10-16 ENCOUNTER — Encounter (HOSPITAL_COMMUNITY)
Admission: RE | Admit: 2011-10-16 | Discharge: 2011-10-16 | Disposition: A | Discharge status: Home / Self Care | Payer: Medicare Other | Source: Ambulatory Visit | Attending: Nephrology | Admitting: Nephrology

## 2011-10-16 LAB — IRON AND TIBC
Iron: 32 ug/dL — ABNORMAL LOW (ref 42–135)
TIBC: 182 ug/dL — ABNORMAL LOW (ref 215–435)
UIBC: 150 ug/dL (ref 125–400)

## 2011-10-16 LAB — FERRITIN: Ferritin: 1216 ng/mL — ABNORMAL HIGH (ref 22–322)

## 2011-10-16 LAB — RENAL FUNCTION PANEL
BUN: 20 mg/dL (ref 6–23)
Chloride: 105 mEq/L (ref 96–112)
GFR calc Af Amer: 28 mL/min — ABNORMAL LOW (ref >90)
Glucose, Bld: 122 mg/dL — ABNORMAL HIGH (ref 70–99)
Potassium: 4.6 mEq/L (ref 3.5–5.1)
Sodium: 138 mEq/L (ref 135–145)

## 2011-10-16 MED ORDER — EPOETIN ALFA 20000 UNIT/ML IJ SOLN
INTRAMUSCULAR | Status: AC
  Administered 2011-10-16: 20000 [IU] via SUBCUTANEOUS
  Filled 2011-10-16: qty 1

## 2011-10-16 MED ORDER — EPOETIN ALFA 10000 UNIT/ML IJ SOLN: 20000.0000 [IU] | INTRAMUSCULAR | Status: DC

## 2011-10-23 ENCOUNTER — Encounter (HOSPITAL_COMMUNITY)
Admission: RE | Admit: 2011-10-23 | Discharge: 2011-10-23 | Disposition: A | Discharge status: Home / Self Care | Payer: Medicare Other | Source: Ambulatory Visit | Attending: Nephrology | Admitting: Nephrology

## 2011-10-23 LAB — POCT HEMOGLOBIN-HEMACUE: Hemoglobin: 9.6 g/dL — ABNORMAL LOW (ref 13.0–17.0)

## 2011-10-23 MED ORDER — EPOETIN ALFA 20000 UNIT/ML IJ SOLN
INTRAMUSCULAR | Status: AC
  Administered 2011-10-23: 20000 [IU] via SUBCUTANEOUS
  Filled 2011-10-23: qty 1

## 2011-10-23 MED ORDER — EPOETIN ALFA 10000 UNIT/ML IJ SOLN: 20000.0000 [IU] | INTRAMUSCULAR | Status: DC

## 2011-10-28 ENCOUNTER — Other Ambulatory Visit (HOSPITAL_COMMUNITY): Payer: Self-pay | Admitting: *Deleted

## 2011-10-30 ENCOUNTER — Encounter (HOSPITAL_COMMUNITY)
Admission: RE | Admit: 2011-10-30 | Discharge: 2011-10-30 | Disposition: A | Discharge status: Home / Self Care | Payer: Medicare Other | Source: Ambulatory Visit | Attending: Nephrology | Admitting: Nephrology

## 2011-10-30 DIAGNOSIS — D638 Anemia in other chronic diseases classified elsewhere: Secondary | ICD-10-CM | POA: Insufficient documentation

## 2011-10-30 DIAGNOSIS — N183 Chronic kidney disease, stage 3 unspecified: Secondary | ICD-10-CM | POA: Insufficient documentation

## 2011-10-30 LAB — IRON AND TIBC
Saturation Ratios: 19 % — ABNORMAL LOW (ref 20–55)
TIBC: 162 ug/dL — ABNORMAL LOW (ref 215–435)
UIBC: 132 ug/dL (ref 125–400)

## 2011-10-30 LAB — RENAL FUNCTION PANEL
CO2: 24 mEq/L (ref 19–32)
Calcium: 9.9 mg/dL (ref 8.4–10.5)
Chloride: 99 mEq/L (ref 96–112)
GFR calc Af Amer: 24 mL/min — ABNORMAL LOW (ref >90)
GFR calc non Af Amer: 20 mL/min — ABNORMAL LOW (ref >90)
Glucose, Bld: 92 mg/dL (ref 70–99)
Sodium: 134 mEq/L — ABNORMAL LOW (ref 135–145)

## 2011-10-30 MED ORDER — EPOETIN ALFA 10000 UNIT/ML IJ SOLN: 20000.0000 [IU] | INTRAMUSCULAR | Status: DC

## 2011-10-30 MED ORDER — EPOETIN ALFA 20000 UNIT/ML IJ SOLN
INTRAMUSCULAR | Status: AC
  Filled 2011-10-30: qty 1

## 2011-10-31 LAB — POCT HEMOGLOBIN-HEMACUE: Hemoglobin: 10.5 g/dL — ABNORMAL LOW (ref 13.0–17.0)

## 2011-10-31 LAB — PTH, INTACT AND CALCIUM: PTH: 29.3 pg/mL (ref 14.0–72.0)

## 2011-11-01 MED FILL — Epoetin Alfa Inj 20000 Unit/ML: INTRAMUSCULAR | Qty: 1 | Status: AC

## 2011-11-06 ENCOUNTER — Encounter (HOSPITAL_COMMUNITY)
Admission: RE | Admit: 2011-11-06 | Discharge: 2011-11-06 | Disposition: A | Discharge status: Home / Self Care | Payer: Medicare Other | Source: Ambulatory Visit | Attending: Nephrology | Admitting: Nephrology

## 2011-11-06 LAB — POCT HEMOGLOBIN-HEMACUE: Hemoglobin: 11 g/dL — ABNORMAL LOW (ref 13.0–17.0)

## 2011-11-06 MED ORDER — EPOETIN ALFA 10000 UNIT/ML IJ SOLN: 20000.0000 [IU] | INTRAMUSCULAR | Status: DC

## 2011-11-06 MED ORDER — EPOETIN ALFA 20000 UNIT/ML IJ SOLN
INTRAMUSCULAR | Status: AC
  Administered 2011-11-06: 20000 [IU] via SUBCUTANEOUS
  Filled 2011-11-06: qty 1

## 2011-11-13 ENCOUNTER — Encounter (HOSPITAL_COMMUNITY)
Admission: RE | Admit: 2011-11-13 | Discharge: 2011-11-13 | Disposition: A | Discharge status: Home / Self Care | Payer: Medicare Other | Source: Ambulatory Visit | Attending: Nephrology | Admitting: Nephrology

## 2011-11-13 LAB — POCT HEMOGLOBIN-HEMACUE: Hemoglobin: 10.8 g/dL — ABNORMAL LOW (ref 13.0–17.0)

## 2011-11-13 MED ORDER — EPOETIN ALFA 20000 UNIT/ML IJ SOLN
INTRAMUSCULAR | Status: AC
  Administered 2011-11-13: 20000 [IU] via SUBCUTANEOUS
  Filled 2011-11-13: qty 1

## 2011-11-13 MED ORDER — EPOETIN ALFA 10000 UNIT/ML IJ SOLN: 20000.0000 [IU] | INTRAMUSCULAR | Status: DC

## 2011-11-20 ENCOUNTER — Encounter (HOSPITAL_COMMUNITY)
Admission: RE | Admit: 2011-11-20 | Discharge: 2011-11-20 | Disposition: A | Discharge status: Home / Self Care | Payer: Medicare Other | Source: Ambulatory Visit | Attending: Nephrology | Admitting: Nephrology

## 2011-11-20 LAB — POCT HEMOGLOBIN-HEMACUE: Hemoglobin: 11.1 g/dL — ABNORMAL LOW (ref 13.0–17.0)

## 2011-11-20 MED ORDER — EPOETIN ALFA 10000 UNIT/ML IJ SOLN: 20000.0000 [IU] | INTRAMUSCULAR | Status: DC

## 2011-11-20 MED ORDER — EPOETIN ALFA 20000 UNIT/ML IJ SOLN
INTRAMUSCULAR | Status: AC
  Administered 2011-11-20: 20000 [IU]
  Filled 2011-11-20: qty 1

## 2011-11-27 ENCOUNTER — Encounter (HOSPITAL_COMMUNITY)
Admission: RE | Admit: 2011-11-27 | Discharge: 2011-11-27 | Disposition: A | Discharge status: Home / Self Care | Payer: Medicare Other | Source: Ambulatory Visit | Attending: Nephrology | Admitting: Nephrology

## 2011-11-27 DIAGNOSIS — D638 Anemia in other chronic diseases classified elsewhere: Secondary | ICD-10-CM | POA: Insufficient documentation

## 2011-11-27 DIAGNOSIS — N183 Chronic kidney disease, stage 3 unspecified: Secondary | ICD-10-CM | POA: Insufficient documentation

## 2011-11-27 LAB — IRON AND TIBC: Iron: 11 ug/dL — ABNORMAL LOW (ref 42–135)

## 2011-11-27 LAB — RENAL FUNCTION PANEL
Albumin: 3.2 g/dL — ABNORMAL LOW (ref 3.5–5.2)
BUN: 22 mg/dL (ref 6–23)
GFR calc non Af Amer: 27 mL/min — ABNORMAL LOW (ref >90)
Phosphorus: 3.6 mg/dL (ref 2.3–4.6)
Potassium: 4.4 mEq/L (ref 3.5–5.1)
Sodium: 134 mEq/L — ABNORMAL LOW (ref 135–145)

## 2011-11-27 LAB — FERRITIN: Ferritin: 1122 ng/mL — ABNORMAL HIGH (ref 22–322)

## 2011-11-27 MED ORDER — EPOETIN ALFA 20000 UNIT/ML IJ SOLN
INTRAMUSCULAR | Status: AC
  Administered 2011-11-27: 20000 [IU] via SUBCUTANEOUS
  Filled 2011-11-27: qty 1

## 2011-11-27 MED ORDER — EPOETIN ALFA 10000 UNIT/ML IJ SOLN: 20000.0000 [IU] | INTRAMUSCULAR | Status: DC

## 2011-12-04 ENCOUNTER — Encounter (HOSPITAL_COMMUNITY)
Admission: RE | Admit: 2011-12-04 | Discharge: 2011-12-04 | Disposition: A | Discharge status: Home / Self Care | Payer: Medicare Other | Source: Ambulatory Visit | Attending: Nephrology | Admitting: Nephrology

## 2011-12-04 LAB — POCT HEMOGLOBIN-HEMACUE: Hemoglobin: 11.6 g/dL — ABNORMAL LOW (ref 13.0–17.0)

## 2011-12-04 MED ORDER — EPOETIN ALFA 10000 UNIT/ML IJ SOLN: 20000.0000 [IU] | INTRAMUSCULAR | Status: DC

## 2011-12-11 ENCOUNTER — Encounter (HOSPITAL_COMMUNITY): Payer: Medicare Other

## 2011-12-18 ENCOUNTER — Encounter (HOSPITAL_COMMUNITY)
Admission: RE | Admit: 2011-12-18 | Discharge: 2011-12-18 | Disposition: A | Discharge status: Home / Self Care | Payer: Medicare Other | Source: Ambulatory Visit | Attending: Nephrology | Admitting: Nephrology

## 2011-12-18 LAB — POCT HEMOGLOBIN-HEMACUE: Hemoglobin: 11.4 g/dL — ABNORMAL LOW (ref 13.0–17.0)

## 2011-12-18 MED ORDER — EPOETIN ALFA 10000 UNIT/ML IJ SOLN: 20000.0000 [IU] | INTRAMUSCULAR | Status: DC

## 2011-12-18 MED ORDER — EPOETIN ALFA 20000 UNIT/ML IJ SOLN
INTRAMUSCULAR | Status: AC
  Administered 2011-12-18: 20000 [IU] via SUBCUTANEOUS
  Filled 2011-12-18: qty 1

## 2011-12-25 ENCOUNTER — Encounter (HOSPITAL_COMMUNITY)
Admission: RE | Admit: 2011-12-25 | Discharge: 2011-12-25 | Disposition: A | Discharge status: Home / Self Care | Payer: Medicare Other | Source: Ambulatory Visit | Attending: Nephrology | Admitting: Nephrology

## 2011-12-25 DIAGNOSIS — N183 Chronic kidney disease, stage 3 unspecified: Secondary | ICD-10-CM | POA: Insufficient documentation

## 2011-12-25 DIAGNOSIS — D638 Anemia in other chronic diseases classified elsewhere: Secondary | ICD-10-CM | POA: Insufficient documentation

## 2011-12-25 LAB — IRON AND TIBC
Iron: 44 ug/dL (ref 42–135)
Saturation Ratios: 24 % (ref 20–55)
TIBC: 185 ug/dL — ABNORMAL LOW (ref 215–435)

## 2011-12-25 LAB — RENAL FUNCTION PANEL
Albumin: 3.5 g/dL (ref 3.5–5.2)
CO2: 23 mEq/L (ref 19–32)
Chloride: 101 mEq/L (ref 96–112)
GFR calc Af Amer: 31 mL/min — ABNORMAL LOW (ref >90)
GFR calc non Af Amer: 27 mL/min — ABNORMAL LOW (ref >90)
Potassium: 4.3 mEq/L (ref 3.5–5.1)
Sodium: 134 mEq/L — ABNORMAL LOW (ref 135–145)

## 2011-12-25 LAB — POCT HEMOGLOBIN-HEMACUE: Hemoglobin: 12.4 g/dL — ABNORMAL LOW (ref 13.0–17.0)

## 2012-01-06 IMAGING — US US RENAL
1 series · 14 of 25 positions shown · non-contrast
Comparison: None.

CLINICAL DATA: Stage III chronic renal disease

RENAL/URINARY TRACT ULTRASOUND COMPLETE

[Series 1: us renal · 0.29mm/px · 14 of 36 slices shown]
[im 1/36]
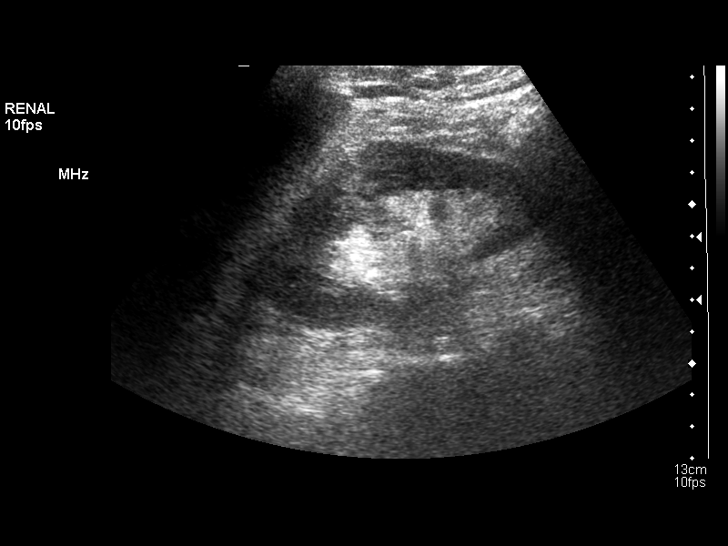
[im 3/36]
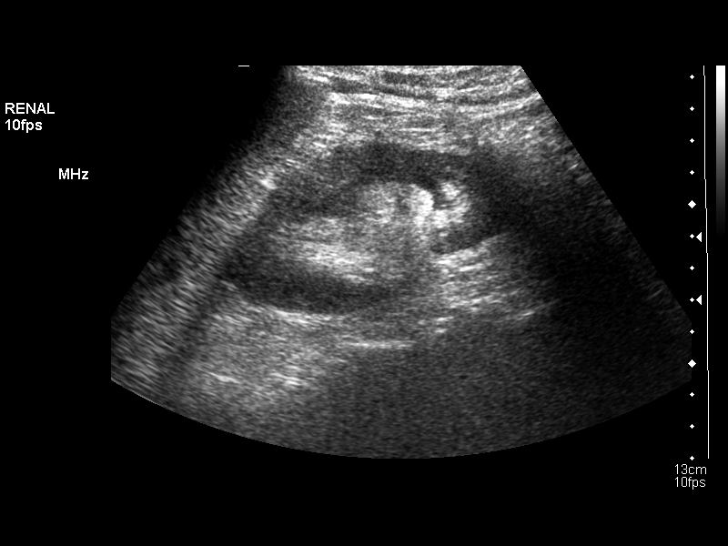
[im 6/36]
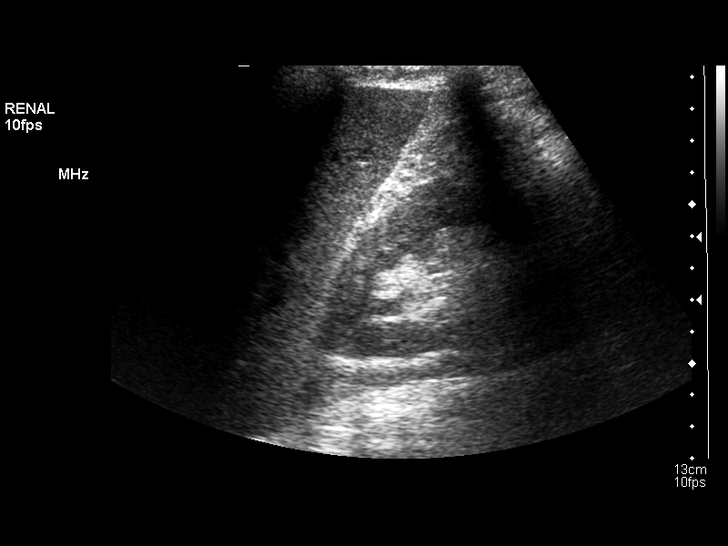
[im 9/36]
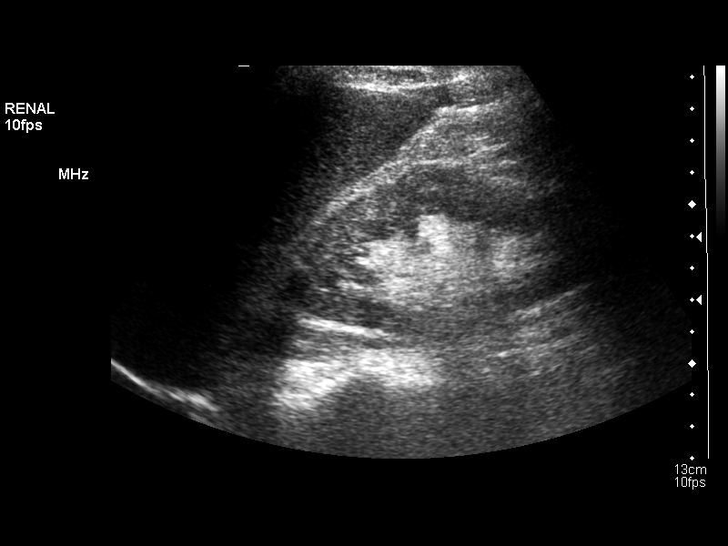
[im 12/36]
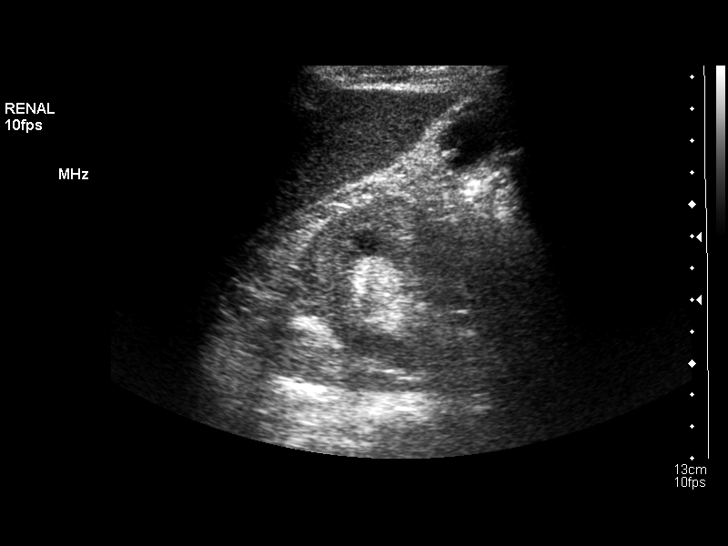
[im 14/36]
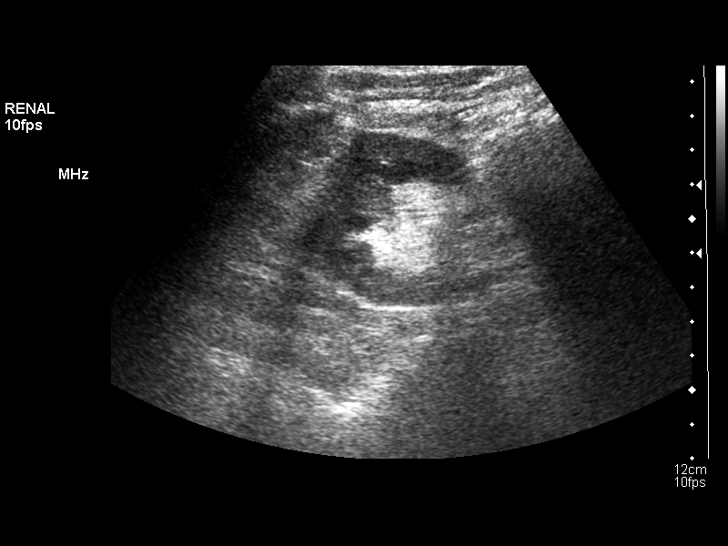
[im 17/36]
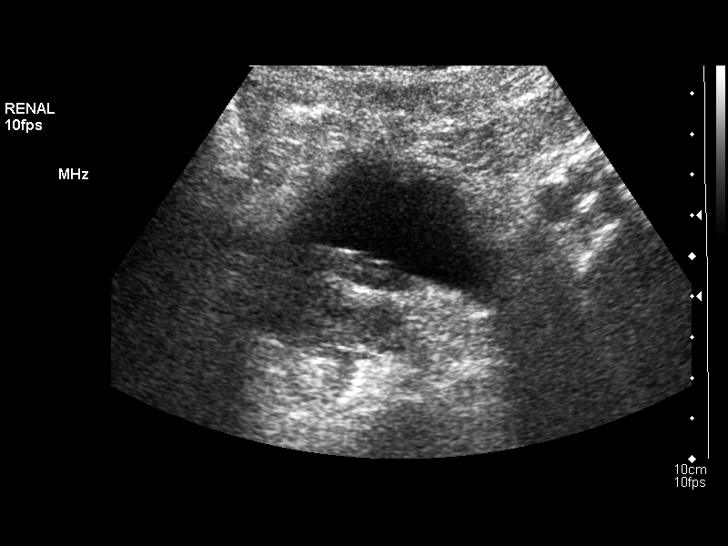
[im 19/36]
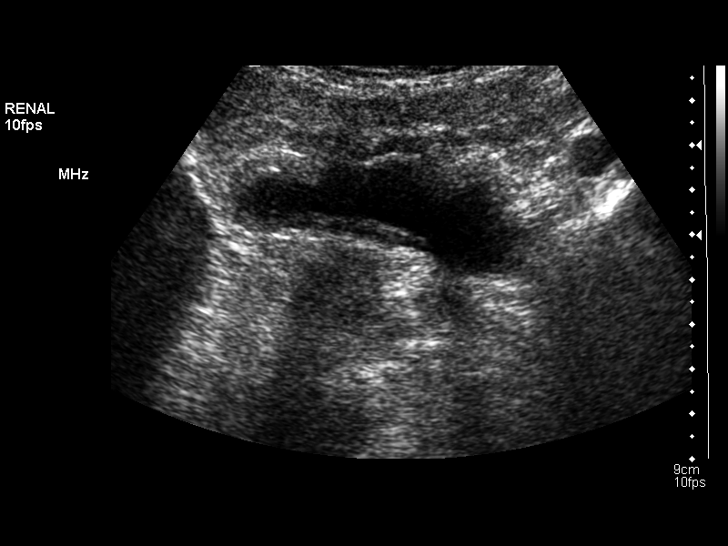
[im 22/36]
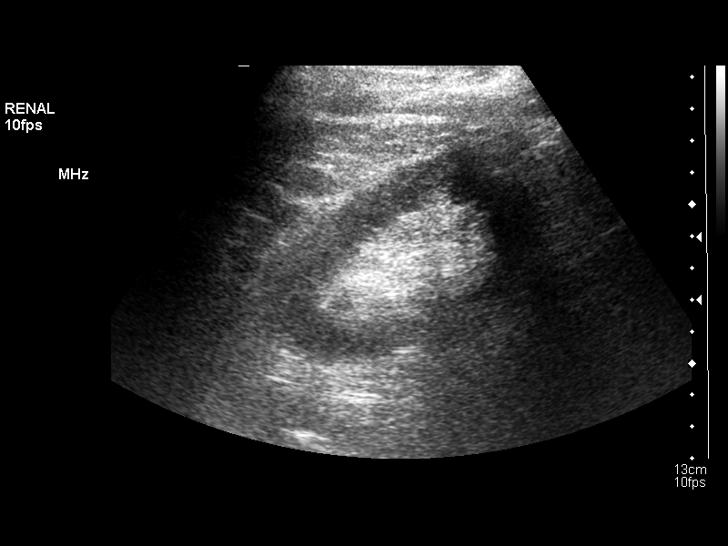
[im 24/36]
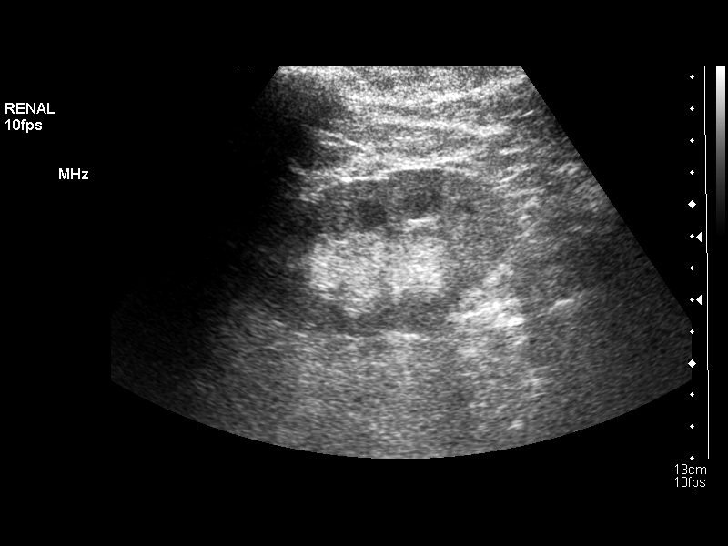
[im 27/36]
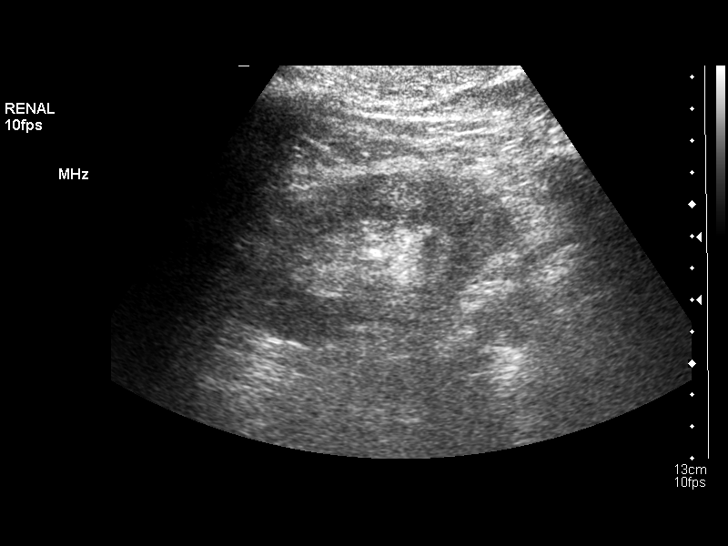
[im 30/36]
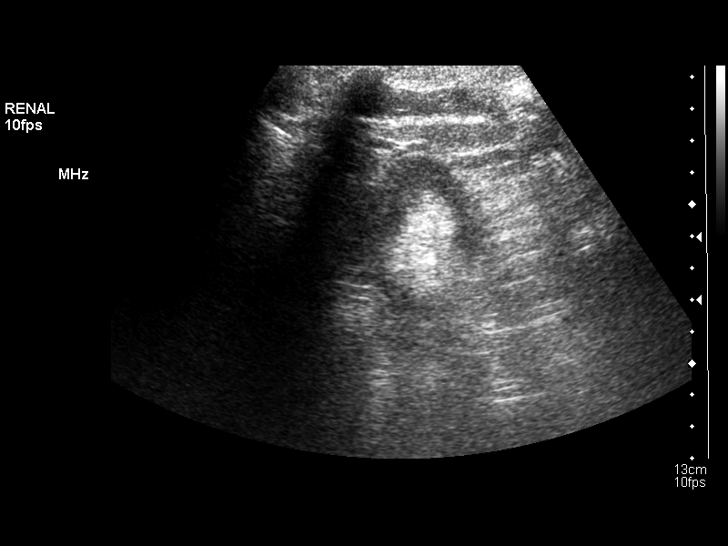
[im 33/36]
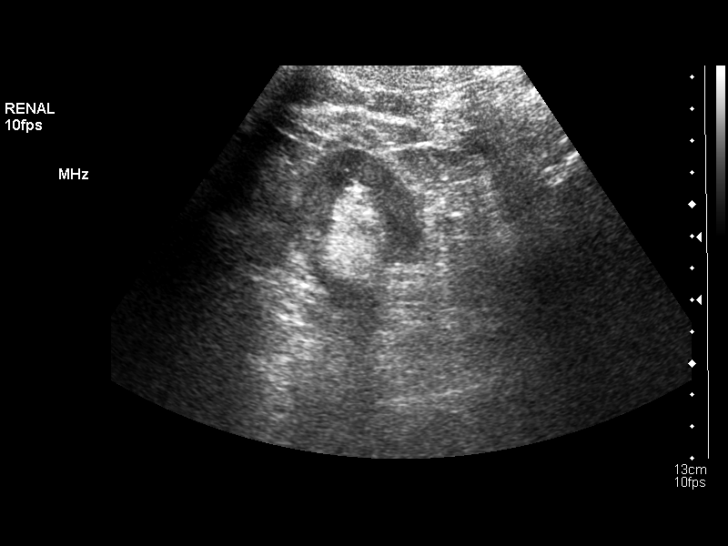
[im 36/36]
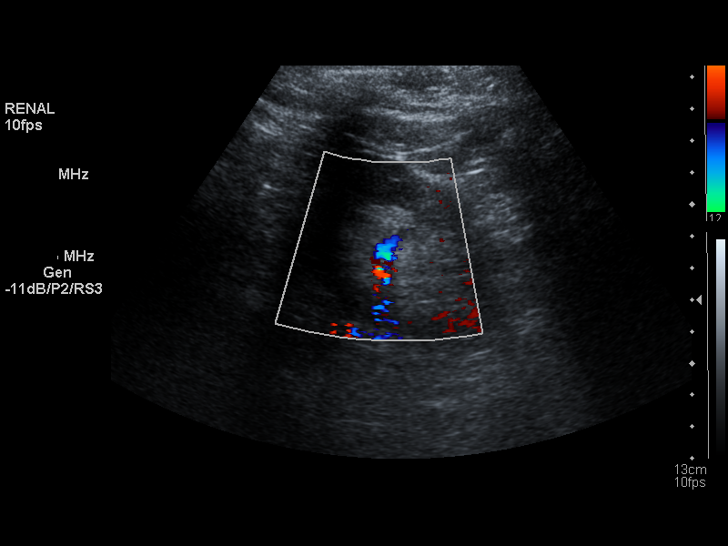

[14 of 25 positions shown; findings below may reference images not displayed]

FINDINGS: Right Kidney:  Normal in size and parenchymal echogenicity.
Measures 10.6 cm in length.  No evidence of mass or hydronephrosis.
There is a 1.4 cm simple cyst at the midportion.

Left Kidney:  Normal in size and parenchymal echogenicity.
Measures 10.0 cm in length.  No evidence of mass or hydronephrosis.

Bladder:  Appears normal for degree of bladder distention.
IMPRESSION: Normal study.

## 2012-01-08 ENCOUNTER — Encounter (HOSPITAL_COMMUNITY)
Admission: RE | Admit: 2012-01-08 | Discharge: 2012-01-08 | Disposition: A | Discharge status: Home / Self Care | Payer: Medicare Other | Source: Ambulatory Visit | Attending: Nephrology | Admitting: Nephrology

## 2012-01-08 MED ORDER — EPOETIN ALFA 10000 UNIT/ML IJ SOLN: 20000.0000 [IU] | INTRAMUSCULAR | Status: DC

## 2012-01-08 MED ORDER — EPOETIN ALFA 20000 UNIT/ML IJ SOLN
INTRAMUSCULAR | Status: AC
  Administered 2012-01-08: 13:00:00
  Filled 2012-01-08: qty 1

## 2012-01-14 ENCOUNTER — Other Ambulatory Visit (HOSPITAL_COMMUNITY): Payer: Self-pay | Admitting: *Deleted

## 2012-01-15 ENCOUNTER — Encounter (HOSPITAL_COMMUNITY)
Admission: RE | Admit: 2012-01-15 | Discharge: 2012-01-15 | Disposition: A | Discharge status: Home / Self Care | Payer: Medicare Other | Source: Ambulatory Visit | Attending: Nephrology | Admitting: Nephrology

## 2012-01-15 MED ORDER — EPOETIN ALFA 20000 UNIT/ML IJ SOLN
INTRAMUSCULAR | Status: AC
  Administered 2012-01-15: 20000 [IU] via SUBCUTANEOUS
  Filled 2012-01-15: qty 1

## 2012-01-15 MED ORDER — EPOETIN ALFA 10000 UNIT/ML IJ SOLN: 20000.0000 [IU] | INTRAMUSCULAR | Status: DC

## 2012-01-21 ENCOUNTER — Other Ambulatory Visit (HOSPITAL_COMMUNITY): Payer: Self-pay | Admitting: *Deleted

## 2012-01-22 ENCOUNTER — Encounter (HOSPITAL_COMMUNITY)
Admission: RE | Admit: 2012-01-22 | Discharge: 2012-01-22 | Disposition: A | Discharge status: Home / Self Care | Payer: Medicare Other | Source: Ambulatory Visit | Attending: Nephrology | Admitting: Nephrology

## 2012-01-22 DIAGNOSIS — N183 Chronic kidney disease, stage 3 unspecified: Secondary | ICD-10-CM | POA: Insufficient documentation

## 2012-01-22 DIAGNOSIS — D638 Anemia in other chronic diseases classified elsewhere: Secondary | ICD-10-CM | POA: Insufficient documentation

## 2012-01-22 LAB — RENAL FUNCTION PANEL
BUN: 28 mg/dL — ABNORMAL HIGH (ref 6–23)
Creatinine, Ser: 2.21 mg/dL — ABNORMAL HIGH (ref 0.50–1.35)
Glucose, Bld: 95 mg/dL (ref 70–99)
Phosphorus: 3.7 mg/dL (ref 2.3–4.6)
Potassium: 4.5 mEq/L (ref 3.5–5.1)

## 2012-01-22 LAB — POCT HEMOGLOBIN-HEMACUE: Hemoglobin: 11.3 g/dL — ABNORMAL LOW (ref 13.0–17.0)

## 2012-01-22 LAB — MAGNESIUM: Magnesium: 2.1 mg/dL (ref 1.5–2.5)

## 2012-01-22 MED ORDER — EPOETIN ALFA 10000 UNIT/ML IJ SOLN: 20000.0000 [IU] | INTRAMUSCULAR | Status: DC

## 2012-01-22 MED ORDER — EPOETIN ALFA 20000 UNIT/ML IJ SOLN
INTRAMUSCULAR | Status: AC
  Administered 2012-01-22: 20000 [IU] via SUBCUTANEOUS
  Filled 2012-01-22: qty 1

## 2012-01-23 LAB — PTH, INTACT AND CALCIUM: PTH: 38.6 pg/mL (ref 14.0–72.0)

## 2012-01-23 LAB — VITAMIN D 25 HYDROXY (VIT D DEFICIENCY, FRACTURES): Vit D, 25-Hydroxy: 63 ng/mL (ref 30–89)

## 2012-01-29 ENCOUNTER — Encounter (HOSPITAL_COMMUNITY)
Admission: RE | Admit: 2012-01-29 | Discharge: 2012-01-29 | Disposition: A | Discharge status: Home / Self Care | Payer: Medicare Other | Source: Ambulatory Visit | Attending: Nephrology | Admitting: Nephrology

## 2012-01-29 LAB — IRON AND TIBC
Iron: 40 ug/dL — ABNORMAL LOW (ref 42–135)
Saturation Ratios: 21 % (ref 20–55)

## 2012-01-29 MED ORDER — EPOETIN ALFA 10000 UNIT/ML IJ SOLN: 20000.0000 [IU] | INTRAMUSCULAR | Status: DC

## 2012-02-12 ENCOUNTER — Encounter (HOSPITAL_COMMUNITY)
Admission: RE | Admit: 2012-02-12 | Discharge: 2012-02-12 | Disposition: A | Discharge status: Home / Self Care | Payer: Medicare Other | Source: Ambulatory Visit | Attending: Nephrology | Admitting: Nephrology

## 2012-02-12 LAB — POCT HEMOGLOBIN-HEMACUE: Hemoglobin: 11.1 g/dL — ABNORMAL LOW (ref 13.0–17.0)

## 2012-02-12 MED ORDER — EPOETIN ALFA 20000 UNIT/ML IJ SOLN
INTRAMUSCULAR | Status: AC
  Administered 2012-02-12: 20000 [IU] via SUBCUTANEOUS
  Filled 2012-02-12: qty 1

## 2012-02-12 MED ORDER — EPOETIN ALFA 10000 UNIT/ML IJ SOLN: 20000.0000 [IU] | INTRAMUSCULAR | Status: DC

## 2012-02-18 ENCOUNTER — Emergency Department (HOSPITAL_COMMUNITY)
Admission: EM | Admit: 2012-02-18 | Discharge: 2012-02-18 | Disposition: A | Discharge status: Home / Self Care | Payer: Medicare Other | Attending: Emergency Medicine | Admitting: Emergency Medicine

## 2012-02-18 ENCOUNTER — Encounter (HOSPITAL_COMMUNITY): Payer: Self-pay | Admitting: *Deleted

## 2012-02-18 DIAGNOSIS — I2510 Atherosclerotic heart disease of native coronary artery without angina pectoris: Secondary | ICD-10-CM | POA: Insufficient documentation

## 2012-02-18 DIAGNOSIS — I509 Heart failure, unspecified: Secondary | ICD-10-CM | POA: Insufficient documentation

## 2012-02-18 DIAGNOSIS — K6289 Other specified diseases of anus and rectum: Secondary | ICD-10-CM | POA: Insufficient documentation

## 2012-02-18 DIAGNOSIS — Z941 Heart transplant status: Secondary | ICD-10-CM | POA: Insufficient documentation

## 2012-02-18 DIAGNOSIS — I10 Essential (primary) hypertension: Secondary | ICD-10-CM | POA: Insufficient documentation

## 2012-02-18 DIAGNOSIS — F17200 Nicotine dependence, unspecified, uncomplicated: Secondary | ICD-10-CM | POA: Insufficient documentation

## 2012-02-18 DIAGNOSIS — K602 Anal fissure, unspecified: Secondary | ICD-10-CM

## 2012-02-18 HISTORY — DX: Disorder of kidney and ureter, unspecified: N28.9

## 2012-02-18 HISTORY — DX: Heart failure, unspecified: I50.9

## 2012-02-18 HISTORY — DX: Atherosclerotic heart disease of native coronary artery without angina pectoris: I25.10

## 2012-02-18 HISTORY — DX: Essential (primary) hypertension: I10

## 2012-02-18 LAB — OCCULT BLOOD, POC DEVICE
Fecal Occult Bld: NEGATIVE
Fecal Occult Bld: NEGATIVE

## 2012-02-18 MED ORDER — LIDOCAINE HCL 2 % EX GEL: CUTANEOUS | Status: AC | PRN

## 2012-02-18 NOTE — Discharge Instructions (Signed)
Please sit in a warm tub 4 times a day for 30 minutes to help relieve rectal pain. Use lidocaine jelly to help with pain as well. Follow-up with the surgeon as scheduled.     Anal Fissure, Adult An anal fissure is a small tear or crack in the skin around the anus. Bleeding from a fissure usually stops on its own within a few minutes. However, bleeding will often reoccur with each bowel movement until the crack heals.  CAUSES   Passing large, hard stools.   Frequent diarrheal stools.   Constipation.   Inflammatory bowel disease (Crohn's disease or ulcerative colitis).   Infections.   Anal sex.  SYMPTOMS   Small amounts of blood seen on your stools, on toilet paper, or in the toilet after a bowel movement.   Rectal bleeding.   Painful bowel movements.   Itching or irritation around the anus.  DIAGNOSIS Your caregiver will examine the anal area. An anal fissure can usually be seen with careful inspection. A rectal exam may be performed and a short tube (anoscope) may be used to examine the anal canal. TREATMENT   You may be instructed to take fiber supplements. These supplements can soften your stool to help make bowel movements easier.   Sitz baths may be recommended to help heal the tear. Do not use soap in the sitz baths.   A medicated cream or ointment may be prescribed to lessen discomfort.  HOME CARE INSTRUCTIONS   Maintain a diet high in fruits, whole grains, and vegetables. Avoid constipating foods like bananas and dairy products.   Take sitz baths as directed by your caregiver.   Drink enough fluids to keep your urine clear or pale yellow.   Only take over-the-counter or prescription medicines for pain, discomfort, or fever as directed by your caregiver. Do not take aspirin as this may increase bleeding.   Do not use ointments containing numbing medications (anesthetics) or hydrocortisone. They could slow healing.  SEEK MEDICAL CARE IF:   Your fissure is not  completely healed within 3 days.   You have further bleeding.   You have a fever.   You have diarrhea mixed with blood.   You have pain.   Your problem is getting worse rather than better.  MAKE SURE YOU:   Understand these instructions.   Will watch your condition.   Will get help right away if you are not doing well or get worse.  Document Released: 09/09/2005 Document Revised: 08/29/2011 Document Reviewed: 02/24/2011 [facility] Patient Information 2012 [place], [place].

## 2012-02-18 NOTE — ED Provider Notes (Signed)
Medical screening examination/treatment/procedure(s) were conducted as a shared visit with non-physician practitioner(s) and myself.  I personally evaluated the patient during the encounter  Doug Sou, MD 02/18/12 [phone]

## 2012-02-18 NOTE — ED Notes (Signed)
EDP at bedside  

## 2012-02-18 NOTE — ED Notes (Signed)
Pt is here with rectal pain for one week.  NO bleeding.

## 2012-02-18 NOTE — ED Provider Notes (Addendum)
Complains of pain at  Anus for one week. Has exquisite and severe sharp pain when having a bowel movement. Pain is nonradiating he is been with viscous lidocaine, without relief On exam patient is in no distress. Anal area grossly soiled; I believe there is a tiny fissure at 12:00 Plan sitz baths., lidocaine jelly surgical referral  Doug Sou, MD 02/18/12 1610  Doug Sou, MD 02/18/12 [id number]

## 2012-02-18 NOTE — ED Notes (Signed)
Pt stated that he has been having rectum pain x 1 week. Pain is a sharp pain. No bleeding and no dark colored stools. Pain is worse with gas and pushing. No nausea/vomiting. Pt stated that he has a hx of constipation, but is not currently constipated. He stated that he is scared to push for bowel movements. No cardiac or respiratory distress. Will continue to monitor.

## 2012-02-18 NOTE — ED Provider Notes (Signed)
History     CSN: [id number]  Arrival date & time 02/18/12  0534   First MD Initiated Contact with Patient 02/18/12 0610      6:27 AM HPI Patient reports severe rectal pain for 1 week. States that he has an appt with [facility] but he is in too much pain. Was told he has a fissure. Denies blood in stool, abdominal pain, n/v, diarrhea, constipation, fever or back pain. Reports last nml BM was yesterday. Describes his stool and soft and small BMs. Reports 2 days ago he began taking colace.  Reports no improvement of rectal pain with this The history is provided by the patient and the spouse.    Past Medical History  Diagnosis Date  . Renal disorder   . Coronary artery disease   . CHF (congestive heart failure)   . Hypertension     Past Surgical History  Procedure Date  . Heart transplant     No family history on file.  History  Substance Use Topics  . Smoking status: Current Everyday Smoker  . Smokeless tobacco: Not on file  . Alcohol Use: No      Review of Systems  Constitutional: Negative for fever and chills.  Respiratory: Negative for shortness of breath.   Cardiovascular: Negative for chest pain.  Gastrointestinal: Positive for rectal pain. Negative for nausea, vomiting, abdominal pain, diarrhea, constipation and blood in stool.  Genitourinary: Negative for dysuria, hematuria, flank pain, discharge, penile pain and testicular pain.  Musculoskeletal: Negative for back pain.  Neurological: Negative for dizziness, weakness, numbness and headaches.  All other systems reviewed and are negative.    Allergies  Review of patient's allergies indicates no known allergies.  Home Medications   Current Outpatient Rx  Name Route Sig Dispense Refill  . CARVEDILOL 12.5 MG PO TABS Oral Take 12.5 mg by mouth 2 (two) times daily with a meal.    . DOCUSATE SODIUM 100 MG PO CAPS Oral Take 100 mg by mouth daily.    . ADULT MULTIVITAMIN W/MINERALS CH Oral Take 1 tablet by mouth daily.     Marland Kitchen MYCOPHENOLATE MOFETIL 500 MG PO TABS Oral Take 500 mg by mouth 2 (two) times daily.    Marland Kitchen PRAVASTATIN SODIUM 20 MG PO TABS Oral Take 20 mg by mouth daily.    Marland Kitchen RANITIDINE HCL 150 MG PO TABS Oral Take 150 mg by mouth 2 (two) times daily.    Marland Kitchen SIROLIMUS 2 MG PO TABS Oral Take 2 mg by mouth daily.    . TOLTERODINE TARTRATE ER 2 MG PO CP24 Oral Take 2 mg by mouth daily.      BP 134/73  Pulse 70  Temp(Src) 98 F (36.7 C) (Oral)  Resp 18  SpO2 97%  Physical Exam  Constitutional: He is oriented to person, place, and time. He appears well-developed and well-nourished.  HENT:  Head: Normocephalic and atraumatic.  Eyes: Conjunctivae are normal. Pupils are equal, round, and reactive to light.  Neck: Normal range of motion. Neck supple.  Cardiovascular: Normal rate, regular rhythm and normal heart sounds.  Exam reveals no gallop and no friction rub.   No murmur heard. Pulmonary/Chest: Effort normal and breath sounds normal. He has no wheezes. He has no rales. He exhibits no tenderness.  Abdominal: Soft. Bowel sounds are normal. He exhibits no distension and no mass. There is no tenderness. There is no rebound and no guarding.  Genitourinary: Rectal exam shows tenderness. Rectal exam shows no external hemorrhoid and anal  tone normal. Guaiac negative stool.       Patient is hemoccult negative. Rectum is tender while wiping brown soft stool with 4 X 4 gauze. No hemorrhoid. Stool palpated within rectal vault.   Neurological: He is alert and oriented to person, place, and time.  Skin: Skin is warm and dry. No rash noted. No erythema. No pallor.  Psychiatric: He has a normal mood and affect. His behavior is normal.    ED Course  Procedures    MDM   Discussed plan with Dr. Felix Pacini, who has see the  Patient and feels he has a fissure at the 12 o clock position. Plan to d/c pt with lidocaine jelly and recommended 4 hot sitz baths for 30 minutes a day. Advised continues use of colace. Pt voices  understanding and is ready for d/c     Thomasene Lot, PA-C 02/18/12 0700

## 2012-02-19 ENCOUNTER — Encounter (HOSPITAL_COMMUNITY): Payer: Medicare Other

## 2012-02-19 ENCOUNTER — Encounter (HOSPITAL_COMMUNITY)
Admission: RE | Admit: 2012-02-19 | Discharge: 2012-02-19 | Disposition: A | Discharge status: Home / Self Care | Payer: Medicare Other | Source: Ambulatory Visit | Attending: Nephrology | Admitting: Nephrology

## 2012-02-19 LAB — RENAL FUNCTION PANEL
Albumin: 3.8 g/dL (ref 3.5–5.2)
GFR calc Af Amer: 26 mL/min — ABNORMAL LOW (ref >90)
Phosphorus: 3.8 mg/dL (ref 2.3–4.6)
Potassium: 4.3 mEq/L (ref 3.5–5.1)
Sodium: 137 mEq/L (ref 135–145)

## 2012-02-19 LAB — POCT HEMOGLOBIN-HEMACUE: Hemoglobin: 11.2 g/dL — ABNORMAL LOW (ref 13.0–17.0)

## 2012-02-19 MED ORDER — EPOETIN ALFA 20000 UNIT/ML IJ SOLN
INTRAMUSCULAR | Status: AC
  Administered 2012-02-19: 20000 [IU] via SUBCUTANEOUS
  Filled 2012-02-19: qty 1

## 2012-02-19 MED ORDER — EPOETIN ALFA 10000 UNIT/ML IJ SOLN: 20000.0000 [IU] | INTRAMUSCULAR | Status: DC

## 2012-02-21 ENCOUNTER — Encounter (INDEPENDENT_AMBULATORY_CARE_PROVIDER_SITE_OTHER): Payer: Self-pay | Admitting: Surgery

## 2012-02-26 ENCOUNTER — Encounter (HOSPITAL_COMMUNITY)
Admission: RE | Admit: 2012-02-26 | Discharge: 2012-02-26 | Disposition: A | Discharge status: Home / Self Care | Payer: Medicare Other | Source: Ambulatory Visit | Attending: Nephrology | Admitting: Nephrology

## 2012-02-26 DIAGNOSIS — N183 Chronic kidney disease, stage 3 unspecified: Secondary | ICD-10-CM | POA: Insufficient documentation

## 2012-02-26 DIAGNOSIS — D638 Anemia in other chronic diseases classified elsewhere: Secondary | ICD-10-CM | POA: Insufficient documentation

## 2012-02-26 LAB — FERRITIN: Ferritin: 1184 ng/mL — ABNORMAL HIGH (ref 22–322)

## 2012-02-26 LAB — IRON AND TIBC: TIBC: 163 ug/dL — ABNORMAL LOW (ref 215–435)

## 2012-02-26 LAB — POCT HEMOGLOBIN-HEMACUE: Hemoglobin: 11.5 g/dL — ABNORMAL LOW (ref 13.0–17.0)

## 2012-02-26 MED ORDER — EPOETIN ALFA 20000 UNIT/ML IJ SOLN
INTRAMUSCULAR | Status: AC
  Administered 2012-02-26: 20000 [IU]
  Filled 2012-02-26: qty 1

## 2012-02-26 MED ORDER — EPOETIN ALFA 10000 UNIT/ML IJ SOLN: 20000.0000 [IU] | INTRAMUSCULAR | Status: DC

## 2012-03-04 ENCOUNTER — Encounter (HOSPITAL_COMMUNITY)
Admission: RE | Admit: 2012-03-04 | Discharge: 2012-03-04 | Disposition: A | Discharge status: Home / Self Care | Payer: Medicare Other | Source: Ambulatory Visit | Attending: Nephrology | Admitting: Nephrology

## 2012-03-04 MED ORDER — EPOETIN ALFA 10000 UNIT/ML IJ SOLN: 20000.0000 [IU] | INTRAMUSCULAR | Status: DC

## 2012-03-04 MED ORDER — EPOETIN ALFA 20000 UNIT/ML IJ SOLN
INTRAMUSCULAR | Status: AC
  Administered 2012-03-04: 20000 [IU] via SUBCUTANEOUS
  Filled 2012-03-04: qty 1

## 2012-03-09 ENCOUNTER — Ambulatory Visit (INDEPENDENT_AMBULATORY_CARE_PROVIDER_SITE_OTHER): Payer: Medicare Other | Admitting: Surgery

## 2012-03-11 ENCOUNTER — Encounter (HOSPITAL_COMMUNITY)
Admission: RE | Admit: 2012-03-11 | Discharge: 2012-03-11 | Disposition: A | Discharge status: Home / Self Care | Payer: Medicare Other | Source: Ambulatory Visit | Attending: Nephrology | Admitting: Nephrology

## 2012-03-11 LAB — POCT HEMOGLOBIN-HEMACUE: Hemoglobin: 9 g/dL — ABNORMAL LOW (ref 13.0–17.0)

## 2012-03-11 MED ORDER — EPOETIN ALFA 10000 UNIT/ML IJ SOLN: 20000.0000 [IU] | INTRAMUSCULAR | Status: DC

## 2012-03-11 MED ORDER — EPOETIN ALFA 20000 UNIT/ML IJ SOLN
INTRAMUSCULAR | Status: AC
  Administered 2012-03-11: 20000 [IU] via SUBCUTANEOUS
  Filled 2012-03-11: qty 1

## 2012-03-18 ENCOUNTER — Encounter (HOSPITAL_COMMUNITY)
Admission: RE | Admit: 2012-03-18 | Discharge: 2012-03-18 | Disposition: A | Discharge status: Home / Self Care | Payer: Medicare Other | Source: Ambulatory Visit | Attending: Nephrology | Admitting: Nephrology

## 2012-03-18 LAB — POCT HEMOGLOBIN-HEMACUE: Hemoglobin: 11.3 g/dL — ABNORMAL LOW (ref 13.0–17.0)

## 2012-03-18 MED ORDER — EPOETIN ALFA 20000 UNIT/ML IJ SOLN
INTRAMUSCULAR | Status: AC
  Administered 2012-03-18: 20000 [IU] via SUBCUTANEOUS
  Filled 2012-03-18: qty 1

## 2012-03-18 MED ORDER — EPOETIN ALFA 10000 UNIT/ML IJ SOLN: 20000.0000 [IU] | INTRAMUSCULAR | Status: DC

## 2012-03-25 ENCOUNTER — Encounter (HOSPITAL_COMMUNITY)
Admission: RE | Admit: 2012-03-25 | Discharge: 2012-03-25 | Disposition: A | Discharge status: Home / Self Care | Payer: Medicare Other | Source: Ambulatory Visit | Attending: Nephrology | Admitting: Nephrology

## 2012-03-25 DIAGNOSIS — N183 Chronic kidney disease, stage 3 unspecified: Secondary | ICD-10-CM | POA: Insufficient documentation

## 2012-03-25 DIAGNOSIS — D638 Anemia in other chronic diseases classified elsewhere: Secondary | ICD-10-CM | POA: Insufficient documentation

## 2012-03-25 LAB — POCT HEMOGLOBIN-HEMACUE: Hemoglobin: 11.5 g/dL — ABNORMAL LOW (ref 13.0–17.0)

## 2012-03-25 MED ORDER — EPOETIN ALFA 10000 UNIT/ML IJ SOLN: 20000.0000 [IU] | INTRAMUSCULAR | Status: DC

## 2012-03-25 MED ORDER — EPOETIN ALFA 20000 UNIT/ML IJ SOLN
INTRAMUSCULAR | Status: AC
  Administered 2012-03-25: 20000 [IU] via SUBCUTANEOUS
  Filled 2012-03-25: qty 1

## 2012-04-01 ENCOUNTER — Encounter (HOSPITAL_COMMUNITY)
Admission: RE | Admit: 2012-04-01 | Discharge: 2012-04-01 | Disposition: A | Discharge status: Home / Self Care | Payer: Medicare Other | Source: Ambulatory Visit | Attending: Nephrology | Admitting: Nephrology

## 2012-04-01 LAB — POCT HEMOGLOBIN-HEMACUE: Hemoglobin: 11.2 g/dL — ABNORMAL LOW (ref 13.0–17.0)

## 2012-04-01 MED ORDER — EPOETIN ALFA 20000 UNIT/ML IJ SOLN
INTRAMUSCULAR | Status: AC
  Administered 2012-04-01: 20000 [IU]
  Filled 2012-04-01: qty 1

## 2012-04-01 MED ORDER — EPOETIN ALFA 10000 UNIT/ML IJ SOLN: 20000.0000 [IU] | INTRAMUSCULAR | Status: DC

## 2012-04-09 ENCOUNTER — Encounter (HOSPITAL_COMMUNITY)
Admission: RE | Admit: 2012-04-09 | Discharge: 2012-04-09 | Disposition: A | Discharge status: Home / Self Care | Payer: Medicare Other | Source: Ambulatory Visit | Attending: Nephrology | Admitting: Nephrology

## 2012-04-09 LAB — RENAL FUNCTION PANEL
Albumin: 3.3 g/dL — ABNORMAL LOW (ref 3.5–5.2)
Chloride: 98 mEq/L (ref 96–112)
GFR calc Af Amer: 28 mL/min — ABNORMAL LOW (ref >90)
GFR calc non Af Amer: 25 mL/min — ABNORMAL LOW (ref >90)
Potassium: 4.5 mEq/L (ref 3.5–5.1)
Sodium: 135 mEq/L (ref 135–145)

## 2012-04-09 LAB — POCT HEMOGLOBIN-HEMACUE: Hemoglobin: 12.4 g/dL — ABNORMAL LOW (ref 13.0–17.0)

## 2012-04-09 MED ORDER — EPOETIN ALFA 10000 UNIT/ML IJ SOLN: 20000.0000 [IU] | INTRAMUSCULAR | Status: DC

## 2012-04-21 ENCOUNTER — Other Ambulatory Visit (HOSPITAL_COMMUNITY): Payer: Self-pay | Admitting: *Deleted

## 2012-04-22 ENCOUNTER — Encounter (HOSPITAL_COMMUNITY)
Admission: RE | Admit: 2012-04-22 | Discharge: 2012-04-22 | Disposition: A | Discharge status: Home / Self Care | Payer: Medicare Other | Source: Ambulatory Visit | Attending: Nephrology | Admitting: Nephrology

## 2012-04-22 LAB — POCT HEMOGLOBIN-HEMACUE: Hemoglobin: 12 g/dL — ABNORMAL LOW (ref 13.0–17.0)

## 2012-04-22 MED ORDER — CLONIDINE HCL 0.1 MG PO TABS: 0.1000 mg | ORAL_TABLET | ORAL | Status: DC | PRN

## 2012-04-22 MED ORDER — EPOETIN ALFA 10000 UNIT/ML IJ SOLN: 20000.0000 [IU] | INTRAMUSCULAR | Status: DC

## 2012-05-06 ENCOUNTER — Encounter (HOSPITAL_COMMUNITY)
Admission: RE | Admit: 2012-05-06 | Discharge: 2012-05-06 | Disposition: A | Discharge status: Home / Self Care | Payer: Medicare Other | Source: Ambulatory Visit | Attending: Nephrology | Admitting: Nephrology

## 2012-05-06 DIAGNOSIS — N183 Chronic kidney disease, stage 3 unspecified: Secondary | ICD-10-CM | POA: Insufficient documentation

## 2012-05-06 DIAGNOSIS — D638 Anemia in other chronic diseases classified elsewhere: Secondary | ICD-10-CM | POA: Insufficient documentation

## 2012-05-06 LAB — PROTEIN / CREATININE RATIO, URINE
Creatinine, Urine: 185.87 mg/dL
Total Protein, Urine: 174.5 mg/dL

## 2012-05-06 LAB — RENAL FUNCTION PANEL
CO2: 23 mEq/L (ref 19–32)
Chloride: 101 mEq/L (ref 96–112)
GFR calc Af Amer: 28 mL/min — ABNORMAL LOW (ref >90)
GFR calc non Af Amer: 24 mL/min — ABNORMAL LOW (ref >90)
Glucose, Bld: 104 mg/dL — ABNORMAL HIGH (ref 70–99)
Sodium: 137 mEq/L (ref 135–145)

## 2012-05-06 LAB — MAGNESIUM: Magnesium: 2 mg/dL (ref 1.5–2.5)

## 2012-05-06 MED ORDER — CLONIDINE HCL 0.1 MG PO TABS: 0.1000 mg | ORAL_TABLET | ORAL | Status: DC | PRN

## 2012-05-06 MED ORDER — EPOETIN ALFA 10000 UNIT/ML IJ SOLN: 20000.0000 [IU] | INTRAMUSCULAR | Status: DC

## 2012-05-07 LAB — VITAMIN D 25 HYDROXY (VIT D DEFICIENCY, FRACTURES): Vit D, 25-Hydroxy: 58 ng/mL (ref 30–89)

## 2012-05-19 ENCOUNTER — Other Ambulatory Visit (HOSPITAL_COMMUNITY): Payer: Self-pay | Admitting: *Deleted

## 2012-05-20 ENCOUNTER — Encounter (HOSPITAL_COMMUNITY)
Admission: RE | Admit: 2012-05-20 | Discharge: 2012-05-20 | Disposition: A | Discharge status: Home / Self Care | Payer: Medicare Other | Source: Ambulatory Visit | Attending: Nephrology | Admitting: Nephrology

## 2012-05-20 MED ORDER — EPOETIN ALFA 10000 UNIT/ML IJ SOLN: 20000.0000 [IU] | INTRAMUSCULAR | Status: DC

## 2012-05-20 MED ORDER — CLONIDINE HCL 0.1 MG PO TABS: 0.1000 mg | ORAL_TABLET | ORAL | Status: DC | PRN

## 2012-06-03 ENCOUNTER — Encounter (HOSPITAL_COMMUNITY)
Admission: RE | Admit: 2012-06-03 | Discharge: 2012-06-03 | Disposition: A | Discharge status: Home / Self Care | Payer: Medicare Other | Source: Ambulatory Visit | Attending: Nephrology | Admitting: Nephrology

## 2012-06-03 DIAGNOSIS — N183 Chronic kidney disease, stage 3 unspecified: Secondary | ICD-10-CM | POA: Insufficient documentation

## 2012-06-03 DIAGNOSIS — D638 Anemia in other chronic diseases classified elsewhere: Secondary | ICD-10-CM | POA: Insufficient documentation

## 2012-06-03 LAB — RENAL FUNCTION PANEL
Albumin: 3.3 g/dL — ABNORMAL LOW (ref 3.5–5.2)
BUN: 29 mg/dL — ABNORMAL HIGH (ref 6–23)
Phosphorus: 3.6 mg/dL (ref 2.3–4.6)
Potassium: 4.4 mEq/L (ref 3.5–5.1)
Sodium: 134 mEq/L — ABNORMAL LOW (ref 135–145)

## 2012-06-03 MED ORDER — EPOETIN ALFA 10000 UNIT/ML IJ SOLN: 20000.0000 [IU] | INTRAMUSCULAR | Status: DC

## 2012-06-03 MED ORDER — EPOETIN ALFA 20000 UNIT/ML IJ SOLN
INTRAMUSCULAR | Status: AC
  Administered 2012-06-03: 20000 [IU] via SUBCUTANEOUS
  Filled 2012-06-03: qty 1

## 2012-06-04 LAB — POCT HEMOGLOBIN-HEMACUE: Hemoglobin: 10.1 g/dL — ABNORMAL LOW (ref 13.0–17.0)

## 2012-06-10 ENCOUNTER — Encounter (HOSPITAL_COMMUNITY)
Admission: RE | Admit: 2012-06-10 | Discharge: 2012-06-10 | Disposition: A | Discharge status: Home / Self Care | Payer: Medicare Other | Source: Ambulatory Visit | Attending: Nephrology | Admitting: Nephrology

## 2012-06-10 MED ORDER — EPOETIN ALFA 10000 UNIT/ML IJ SOLN: 20000.0000 [IU] | INTRAMUSCULAR | Status: DC

## 2012-06-10 MED ORDER — EPOETIN ALFA 20000 UNIT/ML IJ SOLN
INTRAMUSCULAR | Status: AC
  Administered 2012-06-10: 20000 [IU]
  Filled 2012-06-10: qty 1

## 2012-06-10 MED ORDER — CLONIDINE HCL 0.1 MG PO TABS: 0.1000 mg | ORAL_TABLET | ORAL | Status: DC | PRN

## 2012-06-17 ENCOUNTER — Other Ambulatory Visit (HOSPITAL_COMMUNITY): Payer: Self-pay | Admitting: *Deleted

## 2012-06-19 ENCOUNTER — Encounter (HOSPITAL_COMMUNITY)
Admission: RE | Admit: 2012-06-19 | Discharge: 2012-06-19 | Disposition: A | Discharge status: Home / Self Care | Payer: Medicare Other | Source: Ambulatory Visit | Attending: Nephrology | Admitting: Nephrology

## 2012-06-19 MED ORDER — EPOETIN ALFA 10000 UNIT/ML IJ SOLN
30000.0000 [IU] | INTRAMUSCULAR | Status: DC
  Administered 2012-06-19: 10000 [IU] via SUBCUTANEOUS

## 2012-06-19 MED ORDER — EPOETIN ALFA 20000 UNIT/ML IJ SOLN
INTRAMUSCULAR | Status: AC
  Administered 2012-06-19: 20000 [IU] via SUBCUTANEOUS
  Filled 2012-06-19: qty 1

## 2012-06-19 MED ORDER — EPOETIN ALFA 10000 UNIT/ML IJ SOLN
INTRAMUSCULAR | Status: AC
  Administered 2012-06-19: 10000 [IU] via SUBCUTANEOUS
  Filled 2012-06-19: qty 1

## 2012-06-24 ENCOUNTER — Encounter (HOSPITAL_COMMUNITY)
Admission: RE | Admit: 2012-06-24 | Discharge: 2012-06-24 | Disposition: A | Discharge status: Home / Self Care | Payer: Medicare Other | Source: Ambulatory Visit | Attending: Nephrology | Admitting: Nephrology

## 2012-06-24 DIAGNOSIS — D638 Anemia in other chronic diseases classified elsewhere: Secondary | ICD-10-CM | POA: Insufficient documentation

## 2012-06-24 DIAGNOSIS — N183 Chronic kidney disease, stage 3 unspecified: Secondary | ICD-10-CM | POA: Insufficient documentation

## 2012-06-24 LAB — POCT HEMOGLOBIN-HEMACUE: Hemoglobin: 11.6 g/dL — ABNORMAL LOW (ref 13.0–17.0)

## 2012-06-24 MED ORDER — EPOETIN ALFA 10000 UNIT/ML IJ SOLN: 30000.0000 [IU] | INTRAMUSCULAR | Status: DC

## 2012-06-24 MED ORDER — CLONIDINE HCL 0.1 MG PO TABS: 0.1000 mg | ORAL_TABLET | ORAL | Status: DC | PRN

## 2012-07-08 ENCOUNTER — Encounter (HOSPITAL_COMMUNITY)
Admission: RE | Admit: 2012-07-08 | Discharge: 2012-07-08 | Disposition: A | Discharge status: Home / Self Care | Payer: Medicare Other | Source: Ambulatory Visit | Attending: Nephrology | Admitting: Nephrology

## 2012-07-08 LAB — RENAL FUNCTION PANEL
CO2: 21 mEq/L (ref 19–32)
Calcium: 9.6 mg/dL (ref 8.4–10.5)
Creatinine, Ser: 2.27 mg/dL — ABNORMAL HIGH (ref 0.50–1.35)
GFR calc non Af Amer: 27 mL/min — ABNORMAL LOW (ref >90)
Phosphorus: 3.7 mg/dL (ref 2.3–4.6)
Sodium: 134 mEq/L — ABNORMAL LOW (ref 135–145)

## 2012-07-08 MED ORDER — EPOETIN ALFA 10000 UNIT/ML IJ SOLN
30000.0000 [IU] | INTRAMUSCULAR | Status: DC
  Administered 2012-07-08: 10000 [IU] via SUBCUTANEOUS

## 2012-07-08 MED ORDER — EPOETIN ALFA 20000 UNIT/ML IJ SOLN
INTRAMUSCULAR | Status: AC
  Administered 2012-07-08: 20000 [IU] via SUBCUTANEOUS
  Filled 2012-07-08: qty 1

## 2012-07-08 MED ORDER — EPOETIN ALFA 10000 UNIT/ML IJ SOLN
INTRAMUSCULAR | Status: AC
  Administered 2012-07-08: 10000 [IU] via SUBCUTANEOUS
  Filled 2012-07-08: qty 1

## 2012-07-15 ENCOUNTER — Encounter (HOSPITAL_COMMUNITY)
Admission: RE | Admit: 2012-07-15 | Discharge: 2012-07-15 | Disposition: A | Discharge status: Home / Self Care | Payer: Medicare Other | Source: Ambulatory Visit | Attending: Nephrology | Admitting: Nephrology

## 2012-07-15 LAB — POCT HEMOGLOBIN-HEMACUE: Hemoglobin: 10.1 g/dL — ABNORMAL LOW (ref 13.0–17.0)

## 2012-07-15 MED ORDER — EPOETIN ALFA 20000 UNIT/ML IJ SOLN
INTRAMUSCULAR | Status: AC
  Filled 2012-07-15: qty 1

## 2012-07-15 MED ORDER — CLONIDINE HCL 0.1 MG PO TABS: 0.1000 mg | ORAL_TABLET | ORAL | Status: DC | PRN

## 2012-07-15 MED ORDER — EPOETIN ALFA 10000 UNIT/ML IJ SOLN
30000.0000 [IU] | INTRAMUSCULAR | Status: DC
  Administered 2012-07-15: 30000 [IU] via SUBCUTANEOUS

## 2012-07-15 MED ORDER — EPOETIN ALFA 10000 UNIT/ML IJ SOLN
INTRAMUSCULAR | Status: AC
  Administered 2012-07-15: 30000 [IU] via SUBCUTANEOUS
  Filled 2012-07-15: qty 1

## 2012-07-22 ENCOUNTER — Encounter (HOSPITAL_COMMUNITY)
Admission: RE | Admit: 2012-07-22 | Discharge: 2012-07-22 | Disposition: A | Discharge status: Home / Self Care | Payer: Medicare Other | Source: Ambulatory Visit | Attending: Nephrology | Admitting: Nephrology

## 2012-07-22 LAB — POCT HEMOGLOBIN-HEMACUE: Hemoglobin: 10.3 g/dL — ABNORMAL LOW (ref 13.0–17.0)

## 2012-07-22 MED ORDER — EPOETIN ALFA 10000 UNIT/ML IJ SOLN
INTRAMUSCULAR | Status: AC
  Filled 2012-07-22: qty 1

## 2012-07-22 MED ORDER — EPOETIN ALFA 20000 UNIT/ML IJ SOLN
INTRAMUSCULAR | Status: AC
  Administered 2012-07-22: 20000 [IU]
  Filled 2012-07-22: qty 1

## 2012-07-22 MED ORDER — CLONIDINE HCL 0.1 MG PO TABS: 0.1000 mg | ORAL_TABLET | ORAL | Status: DC | PRN

## 2012-07-22 MED ORDER — EPOETIN ALFA 10000 UNIT/ML IJ SOLN
30000.0000 [IU] | INTRAMUSCULAR | Status: DC
  Administered 2012-07-22: 10000 [IU] via SUBCUTANEOUS

## 2012-07-29 ENCOUNTER — Encounter (HOSPITAL_COMMUNITY)
Admission: RE | Admit: 2012-07-29 | Discharge: 2012-07-29 | Disposition: A | Discharge status: Home / Self Care | Payer: Medicare Other | Source: Ambulatory Visit | Attending: Nephrology | Admitting: Nephrology

## 2012-07-29 DIAGNOSIS — N183 Chronic kidney disease, stage 3 unspecified: Secondary | ICD-10-CM | POA: Insufficient documentation

## 2012-07-29 DIAGNOSIS — D638 Anemia in other chronic diseases classified elsewhere: Secondary | ICD-10-CM | POA: Insufficient documentation

## 2012-07-29 LAB — POCT HEMOGLOBIN-HEMACUE: Hemoglobin: 10.8 g/dL — ABNORMAL LOW (ref 13.0–17.0)

## 2012-07-29 MED ORDER — EPOETIN ALFA 10000 UNIT/ML IJ SOLN: 30000.0000 [IU] | INTRAMUSCULAR | Status: DC

## 2012-07-29 MED ORDER — EPOETIN ALFA 10000 UNIT/ML IJ SOLN
INTRAMUSCULAR | Status: AC
  Administered 2012-07-29: 10000 [IU]
  Filled 2012-07-29: qty 1

## 2012-07-29 MED ORDER — EPOETIN ALFA 20000 UNIT/ML IJ SOLN
INTRAMUSCULAR | Status: AC
  Administered 2012-07-29: 20000 [IU]
  Filled 2012-07-29: qty 1

## 2012-07-29 MED ORDER — CLONIDINE HCL 0.1 MG PO TABS: 0.1000 mg | ORAL_TABLET | ORAL | Status: DC | PRN

## 2012-08-05 ENCOUNTER — Encounter (HOSPITAL_COMMUNITY)
Admission: RE | Admit: 2012-08-05 | Discharge: 2012-08-05 | Disposition: A | Discharge status: Home / Self Care | Payer: Medicare Other | Source: Ambulatory Visit | Attending: Nephrology | Admitting: Nephrology

## 2012-08-05 LAB — RENAL FUNCTION PANEL
Albumin: 3.3 g/dL — ABNORMAL LOW (ref 3.5–5.2)
Calcium: 9.7 mg/dL (ref 8.4–10.5)
GFR calc Af Amer: 28 mL/min — ABNORMAL LOW (ref >90)
GFR calc non Af Amer: 24 mL/min — ABNORMAL LOW (ref >90)
Glucose, Bld: 105 mg/dL — ABNORMAL HIGH (ref 70–99)
Phosphorus: 3.8 mg/dL (ref 2.3–4.6)
Potassium: 4.6 mEq/L (ref 3.5–5.1)
Sodium: 136 mEq/L (ref 135–145)

## 2012-08-05 LAB — POCT HEMOGLOBIN-HEMACUE: Hemoglobin: 10.8 g/dL — ABNORMAL LOW (ref 13.0–17.0)

## 2012-08-05 MED ORDER — EPOETIN ALFA 20000 UNIT/ML IJ SOLN
INTRAMUSCULAR | Status: AC
  Administered 2012-08-05: 20000 [IU] via SUBCUTANEOUS
  Filled 2012-08-05: qty 1

## 2012-08-05 MED ORDER — EPOETIN ALFA 10000 UNIT/ML IJ SOLN
30000.0000 [IU] | INTRAMUSCULAR | Status: DC
Start: 2012-08-05 — End: 2012-08-06
  Administered 2012-08-05: 10000 [IU] via SUBCUTANEOUS

## 2012-08-05 MED ORDER — EPOETIN ALFA 10000 UNIT/ML IJ SOLN
INTRAMUSCULAR | Status: AC
  Administered 2012-08-05: 10000 [IU] via SUBCUTANEOUS
  Filled 2012-08-05: qty 1

## 2012-08-12 ENCOUNTER — Encounter (HOSPITAL_COMMUNITY)
Admission: RE | Admit: 2012-08-12 | Discharge: 2012-08-12 | Disposition: A | Discharge status: Home / Self Care | Payer: Medicare Other | Source: Ambulatory Visit | Attending: Nephrology | Admitting: Nephrology

## 2012-08-12 MED ORDER — CLONIDINE HCL 0.1 MG PO TABS: 0.1000 mg | ORAL_TABLET | ORAL | Status: DC | PRN

## 2012-08-12 MED ORDER — EPOETIN ALFA 10000 UNIT/ML IJ SOLN: 30000.0000 [IU] | INTRAMUSCULAR | Status: DC

## 2012-08-26 ENCOUNTER — Encounter (HOSPITAL_COMMUNITY)
Admission: RE | Admit: 2012-08-26 | Discharge: 2012-08-26 | Disposition: A | Discharge status: Home / Self Care | Payer: Medicare Other | Source: Ambulatory Visit | Attending: Nephrology | Admitting: Nephrology

## 2012-08-26 DIAGNOSIS — D638 Anemia in other chronic diseases classified elsewhere: Secondary | ICD-10-CM | POA: Insufficient documentation

## 2012-08-26 DIAGNOSIS — N183 Chronic kidney disease, stage 3 unspecified: Secondary | ICD-10-CM | POA: Insufficient documentation

## 2012-08-26 LAB — POCT HEMOGLOBIN-HEMACUE: Hemoglobin: 12.2 g/dL — ABNORMAL LOW (ref 13.0–17.0)

## 2012-08-26 MED ORDER — EPOETIN ALFA 10000 UNIT/ML IJ SOLN: 30000.0000 [IU] | INTRAMUSCULAR | Status: DC

## 2012-08-26 MED ORDER — CLONIDINE HCL 0.1 MG PO TABS: 0.1000 mg | ORAL_TABLET | ORAL | Status: DC | PRN

## 2012-09-08 ENCOUNTER — Other Ambulatory Visit (HOSPITAL_COMMUNITY): Payer: Self-pay | Admitting: *Deleted

## 2012-09-09 ENCOUNTER — Encounter (HOSPITAL_COMMUNITY)
Admission: RE | Admit: 2012-09-09 | Discharge: 2012-09-09 | Disposition: A | Discharge status: Home / Self Care | Payer: Medicare Other | Source: Ambulatory Visit | Attending: Nephrology | Admitting: Nephrology

## 2012-09-09 LAB — RENAL FUNCTION PANEL
CO2: 22 mEq/L (ref 19–32)
Calcium: 10 mg/dL (ref 8.4–10.5)
Creatinine, Ser: 2.72 mg/dL — ABNORMAL HIGH (ref 0.50–1.35)
GFR calc non Af Amer: 22 mL/min — ABNORMAL LOW (ref >90)
Phosphorus: 3.1 mg/dL (ref 2.3–4.6)

## 2012-09-09 LAB — HEMOGLOBIN AND HEMATOCRIT, BLOOD
HCT: 37 % — ABNORMAL LOW (ref 39.0–52.0)
Hemoglobin: 12 g/dL — ABNORMAL LOW (ref 13.0–17.0)

## 2012-09-09 LAB — POCT HEMOGLOBIN-HEMACUE: Hemoglobin: 11.9 g/dL — ABNORMAL LOW (ref 13.0–17.0)

## 2012-09-09 MED ORDER — EPOETIN ALFA 10000 UNIT/ML IJ SOLN: 30000.0000 [IU] | INTRAMUSCULAR | Status: DC

## 2012-09-09 MED ORDER — CLONIDINE HCL 0.1 MG PO TABS: 0.1000 mg | ORAL_TABLET | ORAL | Status: DC | PRN

## 2012-09-22 ENCOUNTER — Other Ambulatory Visit (HOSPITAL_COMMUNITY): Payer: Self-pay | Admitting: *Deleted

## 2012-09-24 ENCOUNTER — Encounter (HOSPITAL_COMMUNITY)
Admission: RE | Admit: 2012-09-24 | Discharge: 2012-09-24 | Disposition: A | Discharge status: Home / Self Care | Payer: Medicare Other | Source: Ambulatory Visit | Attending: Nephrology | Admitting: Nephrology

## 2012-09-24 DIAGNOSIS — N183 Chronic kidney disease, stage 3 unspecified: Secondary | ICD-10-CM | POA: Insufficient documentation

## 2012-09-24 DIAGNOSIS — D638 Anemia in other chronic diseases classified elsewhere: Secondary | ICD-10-CM | POA: Insufficient documentation

## 2012-09-24 LAB — POCT HEMOGLOBIN-HEMACUE: Hemoglobin: 11.9 g/dL — ABNORMAL LOW (ref 13.0–17.0)

## 2012-09-24 MED ORDER — EPOETIN ALFA 10000 UNIT/ML IJ SOLN: 30000.0000 [IU] | INTRAMUSCULAR | Status: DC

## 2012-09-24 MED ORDER — CLONIDINE HCL 0.1 MG PO TABS: 0.1000 mg | ORAL_TABLET | ORAL | Status: DC | PRN

## 2012-10-05 ENCOUNTER — Other Ambulatory Visit (HOSPITAL_COMMUNITY): Payer: Self-pay | Admitting: *Deleted

## 2012-10-07 ENCOUNTER — Encounter (HOSPITAL_COMMUNITY)
Admission: RE | Admit: 2012-10-07 | Discharge: 2012-10-07 | Disposition: A | Discharge status: Home / Self Care | Payer: Medicare Other | Source: Ambulatory Visit | Attending: Nephrology | Admitting: Nephrology

## 2012-10-07 LAB — RENAL FUNCTION PANEL
Calcium: 9.7 mg/dL (ref 8.4–10.5)
GFR calc Af Amer: 28 mL/min — ABNORMAL LOW (ref >90)
Glucose, Bld: 118 mg/dL — ABNORMAL HIGH (ref 70–99)
Phosphorus: 3.3 mg/dL (ref 2.3–4.6)
Sodium: 136 mEq/L (ref 135–145)

## 2012-10-07 LAB — IRON AND TIBC
Saturation Ratios: 31 % (ref 20–55)
UIBC: 110 ug/dL — ABNORMAL LOW (ref 125–400)

## 2012-10-07 MED ORDER — EPOETIN ALFA 10000 UNIT/ML IJ SOLN
INTRAMUSCULAR | Status: AC
  Administered 2012-10-07: 10000 [IU] via SUBCUTANEOUS
  Filled 2012-10-07: qty 1

## 2012-10-07 MED ORDER — CLONIDINE HCL 0.1 MG PO TABS: 0.1000 mg | ORAL_TABLET | ORAL | Status: DC | PRN

## 2012-10-07 MED ORDER — EPOETIN ALFA 10000 UNIT/ML IJ SOLN: 30000.0000 [IU] | INTRAMUSCULAR | Status: DC

## 2012-10-07 MED ORDER — EPOETIN ALFA 20000 UNIT/ML IJ SOLN
INTRAMUSCULAR | Status: AC
  Administered 2012-10-07: 20000 [IU] via SUBCUTANEOUS
  Filled 2012-10-07: qty 1

## 2012-10-20 ENCOUNTER — Other Ambulatory Visit (HOSPITAL_COMMUNITY): Payer: Self-pay | Admitting: Internal Medicine

## 2012-10-20 DIAGNOSIS — Z48298 Encounter for aftercare following other organ transplant: Secondary | ICD-10-CM

## 2012-10-20 DIAGNOSIS — Z941 Heart transplant status: Secondary | ICD-10-CM

## 2012-10-30 ENCOUNTER — Ambulatory Visit (HOSPITAL_COMMUNITY)
Admission: RE | Admit: 2012-10-30 | Discharge: 2012-10-30 | Disposition: A | Discharge status: Home / Self Care | Payer: Medicare Other | Source: Ambulatory Visit | Attending: Internal Medicine | Admitting: Internal Medicine

## 2012-10-30 DIAGNOSIS — Z941 Heart transplant status: Secondary | ICD-10-CM | POA: Insufficient documentation

## 2012-10-30 DIAGNOSIS — Z48298 Encounter for aftercare following other organ transplant: Secondary | ICD-10-CM

## 2012-10-30 DIAGNOSIS — F17200 Nicotine dependence, unspecified, uncomplicated: Secondary | ICD-10-CM | POA: Insufficient documentation

## 2012-10-30 NOTE — Progress Notes (Signed)
  Echocardiogram 2D Echocardiogram has been performed.  Yazlyn Wentzel FRANCES 10/30/2012, 11:13 AM

## 2012-11-04 ENCOUNTER — Inpatient Hospital Stay (HOSPITAL_COMMUNITY): Admission: RE | Admit: 2012-11-04 | Payer: Medicare Other | Source: Ambulatory Visit

## 2012-11-06 ENCOUNTER — Encounter (HOSPITAL_COMMUNITY): Payer: Medicare Other

## 2012-11-13 ENCOUNTER — Encounter (HOSPITAL_COMMUNITY)
Admission: RE | Admit: 2012-11-13 | Discharge: 2012-11-13 | Disposition: A | Discharge status: Home / Self Care | Payer: Medicare Other | Source: Ambulatory Visit | Attending: Nephrology | Admitting: Nephrology

## 2012-11-13 DIAGNOSIS — N183 Chronic kidney disease, stage 3 unspecified: Secondary | ICD-10-CM | POA: Insufficient documentation

## 2012-11-13 DIAGNOSIS — D638 Anemia in other chronic diseases classified elsewhere: Secondary | ICD-10-CM | POA: Insufficient documentation

## 2012-11-13 LAB — RENAL FUNCTION PANEL
Albumin: 3.1 g/dL — ABNORMAL LOW (ref 3.5–5.2)
GFR calc Af Amer: 31 mL/min — ABNORMAL LOW (ref >90)
GFR calc non Af Amer: 27 mL/min — ABNORMAL LOW (ref >90)
Glucose, Bld: 167 mg/dL — ABNORMAL HIGH (ref 70–99)
Phosphorus: 3.6 mg/dL (ref 2.3–4.6)
Potassium: 4.2 mEq/L (ref 3.5–5.1)
Sodium: 133 mEq/L — ABNORMAL LOW (ref 135–145)

## 2012-11-13 MED ORDER — EPOETIN ALFA 10000 UNIT/ML IJ SOLN
INTRAMUSCULAR | Status: AC
  Administered 2012-11-13: 10000 [IU] via SUBCUTANEOUS
  Filled 2012-11-13: qty 1

## 2012-11-13 MED ORDER — EPOETIN ALFA 10000 UNIT/ML IJ SOLN: 30000.0000 [IU] | INTRAMUSCULAR | Status: DC

## 2012-11-13 MED ORDER — EPOETIN ALFA 20000 UNIT/ML IJ SOLN
INTRAMUSCULAR | Status: AC
  Administered 2012-11-13: 20000 [IU] via SUBCUTANEOUS
  Filled 2012-11-13: qty 1

## 2012-11-13 MED ORDER — CLONIDINE HCL 0.1 MG PO TABS: 0.1000 mg | ORAL_TABLET | ORAL | Status: DC | PRN

## 2012-11-14 LAB — FERRITIN: Ferritin: 1675 ng/mL — ABNORMAL HIGH (ref 22–322)

## 2012-12-09 ENCOUNTER — Encounter (HOSPITAL_COMMUNITY)
Admission: RE | Admit: 2012-12-09 | Discharge: 2012-12-09 | Disposition: A | Discharge status: Home / Self Care | Payer: Medicare Other | Source: Ambulatory Visit | Attending: Nephrology | Admitting: Nephrology

## 2012-12-09 DIAGNOSIS — N183 Chronic kidney disease, stage 3 unspecified: Secondary | ICD-10-CM | POA: Insufficient documentation

## 2012-12-09 DIAGNOSIS — D638 Anemia in other chronic diseases classified elsewhere: Secondary | ICD-10-CM | POA: Insufficient documentation

## 2012-12-09 LAB — RENAL FUNCTION PANEL
BUN: 28 mg/dL — ABNORMAL HIGH (ref 6–23)
CO2: 22 mEq/L (ref 19–32)
Chloride: 103 mEq/L (ref 96–112)
Glucose, Bld: 84 mg/dL (ref 70–99)
Potassium: 4.5 mEq/L (ref 3.5–5.1)

## 2012-12-09 MED ORDER — CLONIDINE HCL 0.1 MG PO TABS: 0.1000 mg | ORAL_TABLET | ORAL | Status: DC | PRN

## 2012-12-09 MED ORDER — EPOETIN ALFA 10000 UNIT/ML IJ SOLN
INTRAMUSCULAR | Status: AC
  Administered 2012-12-09: 10000 [IU] via SUBCUTANEOUS
  Filled 2012-12-09: qty 1

## 2012-12-09 MED ORDER — EPOETIN ALFA 10000 UNIT/ML IJ SOLN
30000.0000 [IU] | INTRAMUSCULAR | Status: DC
  Administered 2012-12-09: 10000 [IU] via SUBCUTANEOUS

## 2012-12-09 MED ORDER — EPOETIN ALFA 20000 UNIT/ML IJ SOLN
INTRAMUSCULAR | Status: AC
  Administered 2012-12-09: 20000 [IU]
  Filled 2012-12-09: qty 1

## 2012-12-10 LAB — IRON AND TIBC
Iron: 57 ug/dL (ref 42–135)
Saturation Ratios: 30 % (ref 20–55)
TIBC: 187 ug/dL — ABNORMAL LOW (ref 215–435)
UIBC: 130 ug/dL (ref 125–400)

## 2012-12-10 LAB — FERRITIN: Ferritin: 1275 ng/mL — ABNORMAL HIGH (ref 22–322)

## 2012-12-24 ENCOUNTER — Encounter (HOSPITAL_COMMUNITY)
Admission: RE | Admit: 2012-12-24 | Discharge: 2012-12-24 | Disposition: A | Discharge status: Home / Self Care | Payer: Medicare Other | Source: Ambulatory Visit | Attending: Nephrology | Admitting: Nephrology

## 2012-12-24 DIAGNOSIS — D638 Anemia in other chronic diseases classified elsewhere: Secondary | ICD-10-CM | POA: Insufficient documentation

## 2012-12-24 DIAGNOSIS — N183 Chronic kidney disease, stage 3 unspecified: Secondary | ICD-10-CM | POA: Insufficient documentation

## 2012-12-24 MED ORDER — CLONIDINE HCL 0.1 MG PO TABS: 0.1000 mg | ORAL_TABLET | ORAL | Status: DC | PRN

## 2012-12-24 MED ORDER — EPOETIN ALFA 20000 UNIT/ML IJ SOLN
INTRAMUSCULAR | Status: AC
  Administered 2012-12-24: 20000 [IU] via SUBCUTANEOUS
  Filled 2012-12-24: qty 1

## 2012-12-24 MED ORDER — EPOETIN ALFA 10000 UNIT/ML IJ SOLN: 20000.0000 [IU] | INTRAMUSCULAR | Status: DC

## 2013-01-06 ENCOUNTER — Encounter (HOSPITAL_COMMUNITY)
Admission: RE | Admit: 2013-01-06 | Discharge: 2013-01-06 | Disposition: A | Discharge status: Home / Self Care | Payer: Medicare Other | Source: Ambulatory Visit | Attending: Nephrology | Admitting: Nephrology

## 2013-01-06 LAB — RENAL FUNCTION PANEL
Albumin: 3.4 g/dL — ABNORMAL LOW (ref 3.5–5.2)
BUN: 31 mg/dL — ABNORMAL HIGH (ref 6–23)
CO2: 23 mEq/L (ref 19–32)
Chloride: 102 mEq/L (ref 96–112)
Glucose, Bld: 109 mg/dL — ABNORMAL HIGH (ref 70–99)
Potassium: 4.2 mEq/L (ref 3.5–5.1)

## 2013-01-06 LAB — IRON AND TIBC
Iron: 37 ug/dL — ABNORMAL LOW (ref 42–135)
Saturation Ratios: 23 % (ref 20–55)
UIBC: 126 ug/dL (ref 125–400)

## 2013-01-06 MED ORDER — CLONIDINE HCL 0.1 MG PO TABS: 0.1000 mg | ORAL_TABLET | ORAL | Status: DC | PRN

## 2013-01-06 MED ORDER — EPOETIN ALFA 20000 UNIT/ML IJ SOLN
INTRAMUSCULAR | Status: AC
  Administered 2013-01-06: 20000 [IU]
  Filled 2013-01-06: qty 1

## 2013-01-06 MED ORDER — EPOETIN ALFA 10000 UNIT/ML IJ SOLN: 20000.0000 [IU] | INTRAMUSCULAR | Status: DC

## 2013-01-07 LAB — FERRITIN: Ferritin: 1613 ng/mL — ABNORMAL HIGH (ref 22–322)

## 2013-01-19 ENCOUNTER — Other Ambulatory Visit (HOSPITAL_COMMUNITY): Payer: Self-pay | Admitting: *Deleted

## 2013-01-20 ENCOUNTER — Encounter (HOSPITAL_COMMUNITY)
Admission: RE | Admit: 2013-01-20 | Discharge: 2013-01-20 | Disposition: A | Discharge status: Home / Self Care | Payer: Medicare Other | Source: Ambulatory Visit | Attending: Nephrology | Admitting: Nephrology

## 2013-01-20 MED ORDER — EPOETIN ALFA 10000 UNIT/ML IJ SOLN: 20000.0000 [IU] | INTRAMUSCULAR | Status: DC

## 2013-01-20 MED ORDER — EPOETIN ALFA 20000 UNIT/ML IJ SOLN
INTRAMUSCULAR | Status: AC
  Administered 2013-01-20: 20000 [IU] via SUBCUTANEOUS
  Filled 2013-01-20: qty 1

## 2013-01-20 MED ORDER — CLONIDINE HCL 0.1 MG PO TABS: 0.1000 mg | ORAL_TABLET | ORAL | Status: DC | PRN

## 2013-01-27 ENCOUNTER — Encounter (HOSPITAL_COMMUNITY)
Admission: RE | Admit: 2013-01-27 | Discharge: 2013-01-27 | Disposition: A | Discharge status: Home / Self Care | Payer: Medicare Other | Source: Ambulatory Visit | Attending: Nephrology | Admitting: Nephrology

## 2013-01-27 DIAGNOSIS — N183 Chronic kidney disease, stage 3 unspecified: Secondary | ICD-10-CM | POA: Insufficient documentation

## 2013-01-27 DIAGNOSIS — D638 Anemia in other chronic diseases classified elsewhere: Secondary | ICD-10-CM | POA: Insufficient documentation

## 2013-01-27 MED ORDER — EPOETIN ALFA 20000 UNIT/ML IJ SOLN
INTRAMUSCULAR | Status: AC
  Administered 2013-01-27: 20000 [IU] via SUBCUTANEOUS
  Filled 2013-01-27: qty 1

## 2013-01-27 MED ORDER — EPOETIN ALFA 10000 UNIT/ML IJ SOLN: 20000.0000 [IU] | INTRAMUSCULAR | Status: DC

## 2013-02-03 ENCOUNTER — Encounter (HOSPITAL_COMMUNITY): Payer: Medicare Other

## 2013-02-05 ENCOUNTER — Encounter (HOSPITAL_COMMUNITY)
Admission: RE | Admit: 2013-02-05 | Discharge: 2013-02-05 | Disposition: A | Discharge status: Home / Self Care | Payer: Medicare Other | Source: Ambulatory Visit | Attending: Nephrology | Admitting: Nephrology

## 2013-02-05 LAB — RENAL FUNCTION PANEL
BUN: 36 mg/dL — ABNORMAL HIGH (ref 6–23)
CO2: 24 mEq/L (ref 19–32)
Chloride: 98 mEq/L (ref 96–112)
GFR calc Af Amer: 31 mL/min — ABNORMAL LOW (ref >90)
Glucose, Bld: 162 mg/dL — ABNORMAL HIGH (ref 70–99)
Potassium: 5 mEq/L (ref 3.5–5.1)
Sodium: 133 mEq/L — ABNORMAL LOW (ref 135–145)

## 2013-02-05 LAB — IRON AND TIBC
Iron: 17 ug/dL — ABNORMAL LOW (ref 42–135)
Saturation Ratios: 13 % — ABNORMAL LOW (ref 20–55)
TIBC: 130 ug/dL — ABNORMAL LOW (ref 215–435)
UIBC: 113 ug/dL — ABNORMAL LOW (ref 125–400)

## 2013-02-05 LAB — FERRITIN: Ferritin: 1144 ng/mL — ABNORMAL HIGH (ref 22–322)

## 2013-02-05 MED ORDER — EPOETIN ALFA 10000 UNIT/ML IJ SOLN: 20000.0000 [IU] | INTRAMUSCULAR | Status: DC

## 2013-02-05 MED ORDER — CLONIDINE HCL 0.1 MG PO TABS: 0.1000 mg | ORAL_TABLET | ORAL | Status: DC | PRN

## 2013-02-05 MED ORDER — EPOETIN ALFA 20000 UNIT/ML IJ SOLN
INTRAMUSCULAR | Status: AC
  Administered 2013-02-05: 20000 [IU] via SUBCUTANEOUS
  Filled 2013-02-05: qty 1

## 2013-02-08 ENCOUNTER — Ambulatory Visit: Payer: 59 | Admitting: Diagnostic Neuroimaging

## 2013-02-12 ENCOUNTER — Encounter (HOSPITAL_COMMUNITY)
Admission: RE | Admit: 2013-02-12 | Discharge: 2013-02-12 | Disposition: A | Discharge status: Home / Self Care | Payer: Medicare Other | Source: Ambulatory Visit | Attending: Nephrology | Admitting: Nephrology

## 2013-02-12 MED ORDER — CLONIDINE HCL 0.1 MG PO TABS: 0.1000 mg | ORAL_TABLET | ORAL | Status: DC | PRN

## 2013-02-12 MED ORDER — EPOETIN ALFA 20000 UNIT/ML IJ SOLN
INTRAMUSCULAR | Status: AC
  Administered 2013-02-12: 20000 [IU] via SUBCUTANEOUS
  Filled 2013-02-12: qty 1

## 2013-02-12 MED ORDER — EPOETIN ALFA 10000 UNIT/ML IJ SOLN: 20000.0000 [IU] | INTRAMUSCULAR | Status: DC

## 2013-02-19 ENCOUNTER — Encounter (HOSPITAL_COMMUNITY)
Admission: RE | Admit: 2013-02-19 | Discharge: 2013-02-19 | Disposition: A | Discharge status: Home / Self Care | Payer: Medicare Other | Source: Ambulatory Visit | Attending: Nephrology | Admitting: Nephrology

## 2013-02-19 MED ORDER — EPOETIN ALFA 10000 UNIT/ML IJ SOLN: 20000.0000 [IU] | INTRAMUSCULAR | Status: DC

## 2013-02-19 MED ORDER — CLONIDINE HCL 0.1 MG PO TABS: 0.1000 mg | ORAL_TABLET | ORAL | Status: DC | PRN

## 2013-02-19 MED ORDER — EPOETIN ALFA 20000 UNIT/ML IJ SOLN
INTRAMUSCULAR | Status: AC
  Administered 2013-02-19: 20000 [IU] via SUBCUTANEOUS
  Filled 2013-02-19: qty 1

## 2013-02-22 ENCOUNTER — Ambulatory Visit: Payer: Medicare Other | Admitting: Diagnostic Neuroimaging

## 2013-02-24 ENCOUNTER — Encounter (HOSPITAL_COMMUNITY)
Admission: RE | Admit: 2013-02-24 | Discharge: 2013-02-24 | Disposition: A | Discharge status: Home / Self Care | Payer: Medicare Other | Source: Ambulatory Visit | Attending: Nephrology | Admitting: Nephrology

## 2013-02-24 DIAGNOSIS — D638 Anemia in other chronic diseases classified elsewhere: Secondary | ICD-10-CM | POA: Insufficient documentation

## 2013-02-24 DIAGNOSIS — N183 Chronic kidney disease, stage 3 unspecified: Secondary | ICD-10-CM | POA: Insufficient documentation

## 2013-02-24 LAB — POCT HEMOGLOBIN-HEMACUE: Hemoglobin: 11.1 g/dL — ABNORMAL LOW (ref 13.0–17.0)

## 2013-02-24 MED ORDER — CLONIDINE HCL 0.1 MG PO TABS: 0.1000 mg | ORAL_TABLET | ORAL | Status: DC | PRN

## 2013-02-24 MED ORDER — EPOETIN ALFA 20000 UNIT/ML IJ SOLN
INTRAMUSCULAR | Status: AC
  Administered 2013-02-24: 20000 [IU] via SUBCUTANEOUS
  Filled 2013-02-24: qty 1

## 2013-02-24 MED ORDER — EPOETIN ALFA 10000 UNIT/ML IJ SOLN: 20000.0000 [IU] | INTRAMUSCULAR | Status: DC

## 2013-03-03 ENCOUNTER — Encounter (HOSPITAL_COMMUNITY)
Admission: RE | Admit: 2013-03-03 | Discharge: 2013-03-03 | Disposition: A | Discharge status: Home / Self Care | Payer: Medicare Other | Source: Ambulatory Visit | Attending: Nephrology | Admitting: Nephrology

## 2013-03-03 LAB — IRON AND TIBC
Iron: 26 ug/dL — ABNORMAL LOW (ref 42–135)
Saturation Ratios: 16 % — ABNORMAL LOW (ref 20–55)
TIBC: 162 ug/dL — ABNORMAL LOW (ref 215–435)
UIBC: 136 ug/dL (ref 125–400)

## 2013-03-03 LAB — RENAL FUNCTION PANEL
BUN: 31 mg/dL — ABNORMAL HIGH (ref 6–23)
CO2: 26 mEq/L (ref 19–32)
Calcium: 9.7 mg/dL (ref 8.4–10.5)
Chloride: 95 mEq/L — ABNORMAL LOW (ref 96–112)
Creatinine, Ser: 2.48 mg/dL — ABNORMAL HIGH (ref 0.50–1.35)

## 2013-03-03 LAB — POCT HEMOGLOBIN-HEMACUE: Hemoglobin: 11.9 g/dL — ABNORMAL LOW (ref 13.0–17.0)

## 2013-03-03 MED ORDER — EPOETIN ALFA 10000 UNIT/ML IJ SOLN: 20000.0000 [IU] | INTRAMUSCULAR | Status: DC

## 2013-03-03 MED ORDER — CLONIDINE HCL 0.1 MG PO TABS: 0.1000 mg | ORAL_TABLET | ORAL | Status: DC | PRN

## 2013-03-17 ENCOUNTER — Encounter (HOSPITAL_COMMUNITY)
Admission: RE | Admit: 2013-03-17 | Discharge: 2013-03-17 | Disposition: A | Discharge status: Home / Self Care | Payer: Medicare Other | Source: Ambulatory Visit | Attending: Nephrology | Admitting: Nephrology

## 2013-03-17 LAB — POCT HEMOGLOBIN-HEMACUE: Hemoglobin: 10.8 g/dL — ABNORMAL LOW (ref 13.0–17.0)

## 2013-03-17 MED ORDER — EPOETIN ALFA 10000 UNIT/ML IJ SOLN: 20000.0000 [IU] | INTRAMUSCULAR | Status: DC

## 2013-03-17 MED ORDER — CLONIDINE HCL 0.1 MG PO TABS: 0.1000 mg | ORAL_TABLET | ORAL | Status: DC | PRN

## 2013-03-17 MED ORDER — EPOETIN ALFA 20000 UNIT/ML IJ SOLN
INTRAMUSCULAR | Status: AC
  Administered 2013-03-17: 20000 [IU] via SUBCUTANEOUS
  Filled 2013-03-17: qty 1

## 2013-03-24 ENCOUNTER — Encounter (HOSPITAL_COMMUNITY)
Admission: RE | Admit: 2013-03-24 | Discharge: 2013-03-24 | Disposition: A | Discharge status: Home / Self Care | Payer: Medicare Other | Source: Ambulatory Visit | Attending: Nephrology | Admitting: Nephrology

## 2013-03-24 DIAGNOSIS — N183 Chronic kidney disease, stage 3 unspecified: Secondary | ICD-10-CM | POA: Insufficient documentation

## 2013-03-24 DIAGNOSIS — D638 Anemia in other chronic diseases classified elsewhere: Secondary | ICD-10-CM | POA: Insufficient documentation

## 2013-03-24 LAB — POCT HEMOGLOBIN-HEMACUE: Hemoglobin: 11.5 g/dL — ABNORMAL LOW (ref 13.0–17.0)

## 2013-03-24 MED ORDER — EPOETIN ALFA 20000 UNIT/ML IJ SOLN
INTRAMUSCULAR | Status: AC
  Administered 2013-03-24: 20000 [IU] via SUBCUTANEOUS
  Filled 2013-03-24: qty 1

## 2013-03-24 MED ORDER — EPOETIN ALFA 10000 UNIT/ML IJ SOLN: 20000.0000 [IU] | INTRAMUSCULAR | Status: DC

## 2013-03-31 ENCOUNTER — Encounter (HOSPITAL_COMMUNITY)
Admission: RE | Admit: 2013-03-31 | Discharge: 2013-03-31 | Disposition: A | Discharge status: Home / Self Care | Payer: Medicare Other | Source: Ambulatory Visit | Attending: Nephrology | Admitting: Nephrology

## 2013-04-13 ENCOUNTER — Other Ambulatory Visit (HOSPITAL_COMMUNITY): Payer: Self-pay | Admitting: *Deleted

## 2013-04-14 ENCOUNTER — Encounter (HOSPITAL_COMMUNITY)
Admission: RE | Admit: 2013-04-14 | Discharge: 2013-04-14 | Disposition: A | Discharge status: Home / Self Care | Payer: Medicare Other | Source: Ambulatory Visit | Attending: Nephrology | Admitting: Nephrology

## 2013-04-14 LAB — RENAL FUNCTION PANEL
Albumin: 3.1 g/dL — ABNORMAL LOW (ref 3.5–5.2)
GFR calc Af Amer: 32 mL/min — ABNORMAL LOW (ref >90)
Glucose, Bld: 81 mg/dL (ref 70–99)
Phosphorus: 3.7 mg/dL (ref 2.3–4.6)
Potassium: 4.6 mEq/L (ref 3.5–5.1)
Sodium: 136 mEq/L (ref 135–145)

## 2013-04-14 MED ORDER — CLONIDINE HCL 0.1 MG PO TABS: 0.1000 mg | ORAL_TABLET | ORAL | Status: DC | PRN

## 2013-04-14 MED ORDER — EPOETIN ALFA 10000 UNIT/ML IJ SOLN: 20000.0000 [IU] | INTRAMUSCULAR | Status: DC

## 2013-04-14 MED ORDER — EPOETIN ALFA 20000 UNIT/ML IJ SOLN
INTRAMUSCULAR | Status: AC
  Administered 2013-04-14: 20000 [IU]
  Filled 2013-04-14: qty 1

## 2013-04-15 LAB — FERRITIN: Ferritin: 1449 ng/mL — ABNORMAL HIGH (ref 22–322)

## 2013-04-20 ENCOUNTER — Other Ambulatory Visit (HOSPITAL_COMMUNITY): Payer: Self-pay | Admitting: *Deleted

## 2013-04-21 ENCOUNTER — Encounter (HOSPITAL_COMMUNITY)
Admission: RE | Admit: 2013-04-21 | Discharge: 2013-04-21 | Disposition: A | Discharge status: Home / Self Care | Payer: Medicare Other | Source: Ambulatory Visit | Attending: Nephrology | Admitting: Nephrology

## 2013-04-21 LAB — POCT HEMOGLOBIN-HEMACUE: Hemoglobin: 11.5 g/dL — ABNORMAL LOW (ref 13.0–17.0)

## 2013-04-21 MED ORDER — CLONIDINE HCL 0.1 MG PO TABS: 0.1000 mg | ORAL_TABLET | ORAL | Status: DC | PRN

## 2013-04-21 MED ORDER — EPOETIN ALFA 10000 UNIT/ML IJ SOLN: 20000.0000 [IU] | INTRAMUSCULAR | Status: DC

## 2013-05-05 ENCOUNTER — Encounter (HOSPITAL_COMMUNITY)
Admission: RE | Admit: 2013-05-05 | Discharge: 2013-05-05 | Disposition: A | Discharge status: Home / Self Care | Payer: Medicare Other | Source: Ambulatory Visit | Attending: Nephrology | Admitting: Nephrology

## 2013-05-05 DIAGNOSIS — D638 Anemia in other chronic diseases classified elsewhere: Secondary | ICD-10-CM | POA: Insufficient documentation

## 2013-05-05 DIAGNOSIS — N183 Chronic kidney disease, stage 3 unspecified: Secondary | ICD-10-CM | POA: Insufficient documentation

## 2013-05-05 LAB — RENAL FUNCTION PANEL
CO2: 24 mEq/L (ref 19–32)
Chloride: 103 mEq/L (ref 96–112)
GFR calc Af Amer: 30 mL/min — ABNORMAL LOW (ref >90)
GFR calc non Af Amer: 26 mL/min — ABNORMAL LOW (ref >90)
Sodium: 137 mEq/L (ref 135–145)

## 2013-05-05 LAB — CBC
HCT: 33.3 % — ABNORMAL LOW (ref 39.0–52.0)
MCHC: 32.1 g/dL (ref 30.0–36.0)
MCV: 86 fL (ref 78.0–100.0)
RDW: 17 % — ABNORMAL HIGH (ref 11.5–15.5)

## 2013-05-05 LAB — FERRITIN: Ferritin: 846 ng/mL — ABNORMAL HIGH (ref 22–322)

## 2013-05-05 LAB — IRON AND TIBC
Iron: 51 ug/dL (ref 42–135)
TIBC: 171 ug/dL — ABNORMAL LOW (ref 215–435)

## 2013-05-05 MED ORDER — EPOETIN ALFA 10000 UNIT/ML IJ SOLN
20000.0000 [IU] | INTRAMUSCULAR | Status: DC
  Administered 2013-05-05: 20000 [IU] via SUBCUTANEOUS

## 2013-05-05 MED ORDER — EPOETIN ALFA 20000 UNIT/ML IJ SOLN
INTRAMUSCULAR | Status: AC
  Filled 2013-05-05: qty 1

## 2013-05-05 MED ORDER — CLONIDINE HCL 0.1 MG PO TABS: 0.1000 mg | ORAL_TABLET | ORAL | Status: DC | PRN

## 2013-05-06 MED FILL — Epoetin Alfa Inj 20000 Unit/ML: INTRAMUSCULAR | Qty: 1 | Status: AC

## 2013-05-12 ENCOUNTER — Encounter (HOSPITAL_COMMUNITY)
Admission: RE | Admit: 2013-05-12 | Discharge: 2013-05-12 | Disposition: A | Discharge status: Home / Self Care | Payer: Medicare Other | Source: Ambulatory Visit | Attending: Nephrology | Admitting: Nephrology

## 2013-05-12 MED ORDER — EPOETIN ALFA 20000 UNIT/ML IJ SOLN
INTRAMUSCULAR | Status: AC
  Filled 2013-05-12: qty 1

## 2013-05-12 MED ORDER — EPOETIN ALFA 10000 UNIT/ML IJ SOLN
20000.0000 [IU] | INTRAMUSCULAR | Status: DC
  Administered 2013-05-12: 20000 [IU] via SUBCUTANEOUS

## 2013-05-12 MED ORDER — CLONIDINE HCL 0.1 MG PO TABS: 0.1000 mg | ORAL_TABLET | ORAL | Status: DC | PRN

## 2013-05-13 MED FILL — Epoetin Alfa Inj 20000 Unit/ML: INTRAMUSCULAR | Qty: 1 | Status: AC

## 2013-05-19 ENCOUNTER — Encounter (HOSPITAL_COMMUNITY)
Admission: RE | Admit: 2013-05-19 | Discharge: 2013-05-19 | Disposition: A | Discharge status: Home / Self Care | Payer: Medicare Other | Source: Ambulatory Visit | Attending: Nephrology | Admitting: Nephrology

## 2013-05-19 MED ORDER — CLONIDINE HCL 0.1 MG PO TABS: 0.1000 mg | ORAL_TABLET | ORAL | Status: DC | PRN

## 2013-05-19 MED ORDER — EPOETIN ALFA 20000 UNIT/ML IJ SOLN
INTRAMUSCULAR | Status: AC
  Administered 2013-05-19: 20000 [IU] via SUBCUTANEOUS
  Filled 2013-05-19: qty 1

## 2013-05-19 MED ORDER — EPOETIN ALFA 10000 UNIT/ML IJ SOLN: 20000.0000 [IU] | INTRAMUSCULAR | Status: DC

## 2013-05-26 ENCOUNTER — Encounter (HOSPITAL_COMMUNITY)
Admission: RE | Admit: 2013-05-26 | Discharge: 2013-05-26 | Disposition: A | Discharge status: Home / Self Care | Payer: Medicare Other | Source: Ambulatory Visit | Attending: Nephrology | Admitting: Nephrology

## 2013-05-26 DIAGNOSIS — D638 Anemia in other chronic diseases classified elsewhere: Secondary | ICD-10-CM | POA: Insufficient documentation

## 2013-05-26 DIAGNOSIS — N183 Chronic kidney disease, stage 3 unspecified: Secondary | ICD-10-CM | POA: Insufficient documentation

## 2013-05-26 MED ORDER — EPOETIN ALFA 10000 UNIT/ML IJ SOLN
20000.0000 [IU] | INTRAMUSCULAR | Status: DC
Start: 2013-05-26 — End: 2013-05-27

## 2013-05-26 MED ORDER — CLONIDINE HCL 0.1 MG PO TABS: 0.1000 mg | ORAL_TABLET | ORAL | Status: DC | PRN

## 2013-06-09 ENCOUNTER — Encounter (HOSPITAL_COMMUNITY)
Admission: RE | Admit: 2013-06-09 | Discharge: 2013-06-09 | Disposition: A | Discharge status: Home / Self Care | Payer: Medicare Other | Source: Ambulatory Visit | Attending: Nephrology | Admitting: Nephrology

## 2013-06-09 LAB — RENAL FUNCTION PANEL
BUN: 30 mg/dL — ABNORMAL HIGH (ref 6–23)
CO2: 23 mEq/L (ref 19–32)
Chloride: 99 mEq/L (ref 96–112)
Creatinine, Ser: 2.41 mg/dL — ABNORMAL HIGH (ref 0.50–1.35)
Glucose, Bld: 89 mg/dL (ref 70–99)

## 2013-06-09 LAB — POCT HEMOGLOBIN-HEMACUE: Hemoglobin: 11.8 g/dL — ABNORMAL LOW (ref 13.0–17.0)

## 2013-06-09 MED ORDER — CLONIDINE HCL 0.1 MG PO TABS: 0.1000 mg | ORAL_TABLET | ORAL | Status: DC | PRN

## 2013-06-09 MED ORDER — EPOETIN ALFA 10000 UNIT/ML IJ SOLN: 20000.0000 [IU] | INTRAMUSCULAR | Status: DC

## 2013-06-10 LAB — IRON AND TIBC
Iron: 28 ug/dL — ABNORMAL LOW (ref 42–135)
TIBC: 160 ug/dL — ABNORMAL LOW (ref 215–435)
UIBC: 132 ug/dL (ref 125–400)

## 2013-06-23 ENCOUNTER — Encounter (HOSPITAL_COMMUNITY)
Admission: RE | Admit: 2013-06-23 | Discharge: 2013-06-23 | Disposition: A | Discharge status: Home / Self Care | Payer: Medicare Other | Source: Ambulatory Visit | Attending: Nephrology | Admitting: Nephrology

## 2013-06-23 DIAGNOSIS — D638 Anemia in other chronic diseases classified elsewhere: Secondary | ICD-10-CM | POA: Insufficient documentation

## 2013-06-23 DIAGNOSIS — N183 Chronic kidney disease, stage 3 unspecified: Secondary | ICD-10-CM | POA: Insufficient documentation

## 2013-06-23 MED ORDER — EPOETIN ALFA 20000 UNIT/ML IJ SOLN
INTRAMUSCULAR | Status: AC
  Administered 2013-06-23: 20000 [IU]
  Filled 2013-06-23: qty 1

## 2013-06-23 MED ORDER — EPOETIN ALFA 10000 UNIT/ML IJ SOLN: 20000.0000 [IU] | INTRAMUSCULAR | Status: DC

## 2013-06-30 ENCOUNTER — Encounter (HOSPITAL_COMMUNITY)
Admission: RE | Admit: 2013-06-30 | Discharge: 2013-06-30 | Disposition: A | Discharge status: Home / Self Care | Payer: Medicare Other | Source: Ambulatory Visit | Attending: Nephrology | Admitting: Nephrology

## 2013-06-30 MED ORDER — EPOETIN ALFA 10000 UNIT/ML IJ SOLN: 20000.0000 [IU] | INTRAMUSCULAR | Status: DC

## 2013-06-30 MED ORDER — EPOETIN ALFA 20000 UNIT/ML IJ SOLN
INTRAMUSCULAR | Status: AC
  Administered 2013-06-30: 20000 [IU] via SUBCUTANEOUS
  Filled 2013-06-30: qty 1

## 2013-06-30 MED ORDER — CLONIDINE HCL 0.1 MG PO TABS: 0.1000 mg | ORAL_TABLET | ORAL | Status: DC | PRN

## 2013-06-30 NOTE — Progress Notes (Signed)
Denies having been to [place] or being around anyone who has.

## 2013-07-07 ENCOUNTER — Encounter (HOSPITAL_COMMUNITY)
Admission: RE | Admit: 2013-07-07 | Discharge: 2013-07-07 | Disposition: A | Discharge status: Home / Self Care | Payer: Medicare Other | Source: Ambulatory Visit | Attending: Nephrology | Admitting: Nephrology

## 2013-07-07 LAB — RENAL FUNCTION PANEL
Albumin: 3.2 g/dL — ABNORMAL LOW (ref 3.5–5.2)
BUN: 24 mg/dL — ABNORMAL HIGH (ref 6–23)
Calcium: 9.1 mg/dL (ref 8.4–10.5)
Chloride: 101 mEq/L (ref 96–112)
GFR calc Af Amer: 30 mL/min — ABNORMAL LOW (ref >90)
GFR calc non Af Amer: 26 mL/min — ABNORMAL LOW (ref >90)
Glucose, Bld: 97 mg/dL (ref 70–99)
Potassium: 4.2 mEq/L (ref 3.5–5.1)
Sodium: 135 mEq/L (ref 135–145)

## 2013-07-07 LAB — FERRITIN: Ferritin: 977 ng/mL — ABNORMAL HIGH (ref 22–322)

## 2013-07-07 LAB — IRON AND TIBC
Iron: 39 ug/dL — ABNORMAL LOW (ref 42–135)
UIBC: 120 ug/dL — ABNORMAL LOW (ref 125–400)

## 2013-07-07 LAB — POCT HEMOGLOBIN-HEMACUE: Hemoglobin: 10.8 g/dL — ABNORMAL LOW (ref 13.0–17.0)

## 2013-07-07 MED ORDER — EPOETIN ALFA 20000 UNIT/ML IJ SOLN
INTRAMUSCULAR | Status: AC
  Administered 2013-07-07: 13:00:00 20000 [IU]
  Filled 2013-07-07: qty 1

## 2013-07-07 MED ORDER — EPOETIN ALFA 10000 UNIT/ML IJ SOLN: 20000.0000 [IU] | INTRAMUSCULAR | Status: DC

## 2013-07-13 ENCOUNTER — Other Ambulatory Visit (HOSPITAL_COMMUNITY): Payer: Self-pay | Admitting: *Deleted

## 2013-07-14 ENCOUNTER — Encounter (HOSPITAL_COMMUNITY)
Admission: RE | Admit: 2013-07-14 | Discharge: 2013-07-14 | Disposition: A | Discharge status: Home / Self Care | Payer: Medicare Other | Source: Ambulatory Visit | Attending: Nephrology | Admitting: Nephrology

## 2013-07-14 LAB — POCT HEMOGLOBIN-HEMACUE: Hemoglobin: 9.2 g/dL — ABNORMAL LOW (ref 13.0–17.0)

## 2013-07-14 MED ORDER — CLONIDINE HCL 0.1 MG PO TABS: 0.1000 mg | ORAL_TABLET | ORAL | Status: DC | PRN

## 2013-07-14 MED ORDER — EPOETIN ALFA 10000 UNIT/ML IJ SOLN
20000.0000 [IU] | INTRAMUSCULAR | Status: DC
  Administered 2013-07-14: 14:00:00 20000 [IU] via SUBCUTANEOUS

## 2013-07-14 MED ORDER — EPOETIN ALFA 20000 UNIT/ML IJ SOLN
INTRAMUSCULAR | Status: AC
  Filled 2013-07-14: qty 1

## 2013-07-20 ENCOUNTER — Other Ambulatory Visit (HOSPITAL_COMMUNITY): Payer: Self-pay | Admitting: *Deleted

## 2013-07-21 ENCOUNTER — Encounter (HOSPITAL_COMMUNITY)
Admission: RE | Admit: 2013-07-21 | Discharge: 2013-07-21 | Disposition: A | Discharge status: Home / Self Care | Payer: Medicare Other | Source: Ambulatory Visit | Attending: Nephrology | Admitting: Nephrology

## 2013-07-21 MED ORDER — EPOETIN ALFA 20000 UNIT/ML IJ SOLN
INTRAMUSCULAR | Status: AC
  Administered 2013-07-21: 20000 [IU] via SUBCUTANEOUS
  Filled 2013-07-21: qty 1

## 2013-07-21 MED ORDER — EPOETIN ALFA 10000 UNIT/ML IJ SOLN: 20000.0000 [IU] | INTRAMUSCULAR | Status: DC

## 2013-07-21 MED ORDER — CLONIDINE HCL 0.1 MG PO TABS: 0.1000 mg | ORAL_TABLET | ORAL | Status: DC | PRN

## 2013-07-22 LAB — POCT HEMOGLOBIN-HEMACUE: Hemoglobin: 8.9 g/dL — ABNORMAL LOW (ref 13.0–17.0)

## 2013-07-28 ENCOUNTER — Encounter (HOSPITAL_COMMUNITY)
Admission: RE | Admit: 2013-07-28 | Discharge: 2013-07-28 | Disposition: A | Discharge status: Home / Self Care | Payer: Medicare Other | Source: Ambulatory Visit | Attending: Nephrology | Admitting: Nephrology

## 2013-07-28 DIAGNOSIS — D638 Anemia in other chronic diseases classified elsewhere: Secondary | ICD-10-CM | POA: Insufficient documentation

## 2013-07-28 DIAGNOSIS — N183 Chronic kidney disease, stage 3 unspecified: Secondary | ICD-10-CM | POA: Insufficient documentation

## 2013-07-28 LAB — MAGNESIUM: Magnesium: 1.9 mg/dL (ref 1.5–2.5)

## 2013-07-28 LAB — RENAL FUNCTION PANEL
Albumin: 2.7 g/dL — ABNORMAL LOW (ref 3.5–5.2)
BUN: 34 mg/dL — ABNORMAL HIGH (ref 6–23)
Chloride: 100 mEq/L (ref 96–112)
Creatinine, Ser: 2.37 mg/dL — ABNORMAL HIGH (ref 0.50–1.35)
GFR calc non Af Amer: 25 mL/min — ABNORMAL LOW (ref >90)
Phosphorus: 2.9 mg/dL (ref 2.3–4.6)
Potassium: 4.7 mEq/L (ref 3.5–5.1)
Sodium: 134 mEq/L — ABNORMAL LOW (ref 135–145)

## 2013-07-28 LAB — HEMOGLOBIN AND HEMATOCRIT, BLOOD: Hemoglobin: 8.9 g/dL — ABNORMAL LOW (ref 13.0–17.0)

## 2013-07-28 MED ORDER — EPOETIN ALFA 10000 UNIT/ML IJ SOLN: 20000.0000 [IU] | INTRAMUSCULAR | Status: DC

## 2013-07-28 MED ORDER — CLONIDINE HCL 0.1 MG PO TABS: 0.1000 mg | ORAL_TABLET | ORAL | Status: DC | PRN

## 2013-07-28 MED ORDER — EPOETIN ALFA 20000 UNIT/ML IJ SOLN
INTRAMUSCULAR | Status: AC
  Administered 2013-07-28: 20000 [IU] via SUBCUTANEOUS
  Filled 2013-07-28: qty 1

## 2013-08-03 ENCOUNTER — Other Ambulatory Visit (HOSPITAL_COMMUNITY): Payer: Self-pay | Admitting: *Deleted

## 2013-08-04 ENCOUNTER — Encounter (HOSPITAL_COMMUNITY)
Admission: RE | Admit: 2013-08-04 | Discharge: 2013-08-04 | Disposition: A | Discharge status: Home / Self Care | Payer: Medicare Other | Source: Ambulatory Visit | Attending: Nephrology | Admitting: Nephrology

## 2013-08-04 LAB — IRON AND TIBC
Saturation Ratios: 16 % — ABNORMAL LOW (ref 20–55)
UIBC: 125 ug/dL (ref 125–400)

## 2013-08-04 LAB — FERRITIN: Ferritin: 944 ng/mL — ABNORMAL HIGH (ref 22–322)

## 2013-08-04 LAB — POCT HEMOGLOBIN-HEMACUE: Hemoglobin: 9 g/dL — ABNORMAL LOW (ref 13.0–17.0)

## 2013-08-04 MED ORDER — EPOETIN ALFA 10000 UNIT/ML IJ SOLN: 30000.0000 [IU] | INTRAMUSCULAR | Status: DC

## 2013-08-04 MED ORDER — EPOETIN ALFA 10000 UNIT/ML IJ SOLN
INTRAMUSCULAR | Status: AC
  Administered 2013-08-04: 10000 [IU] via SUBCUTANEOUS
  Filled 2013-08-04: qty 1

## 2013-08-04 MED ORDER — EPOETIN ALFA 20000 UNIT/ML IJ SOLN
INTRAMUSCULAR | Status: AC
  Administered 2013-08-04: 15:00:00 20000 [IU] via SUBCUTANEOUS
  Filled 2013-08-04: qty 1

## 2013-08-10 ENCOUNTER — Other Ambulatory Visit (HOSPITAL_COMMUNITY): Payer: Self-pay | Admitting: *Deleted

## 2013-08-11 ENCOUNTER — Encounter (HOSPITAL_COMMUNITY)
Admission: RE | Admit: 2013-08-11 | Discharge: 2013-08-11 | Disposition: A | Discharge status: Home / Self Care | Payer: Medicare Other | Source: Ambulatory Visit | Attending: Nephrology | Admitting: Nephrology

## 2013-08-11 LAB — POCT HEMOGLOBIN-HEMACUE: Hemoglobin: 8.9 g/dL — ABNORMAL LOW (ref 13.0–17.0)

## 2013-08-11 MED ORDER — EPOETIN ALFA 10000 UNIT/ML IJ SOLN
INTRAMUSCULAR | Status: AC
  Administered 2013-08-11: 10000 [IU] via SUBCUTANEOUS
  Filled 2013-08-11: qty 1

## 2013-08-11 MED ORDER — EPOETIN ALFA 20000 UNIT/ML IJ SOLN
INTRAMUSCULAR | Status: AC
  Administered 2013-08-11: 14:00:00 20000 [IU] via SUBCUTANEOUS
  Filled 2013-08-11: qty 1

## 2013-08-11 MED ORDER — EPOETIN ALFA 10000 UNIT/ML IJ SOLN: 30000.0000 [IU] | INTRAMUSCULAR | Status: DC

## 2013-08-18 ENCOUNTER — Encounter (HOSPITAL_COMMUNITY)
Admission: RE | Admit: 2013-08-18 | Discharge: 2013-08-18 | Disposition: A | Discharge status: Home / Self Care | Payer: Medicare Other | Source: Ambulatory Visit | Attending: Nephrology | Admitting: Nephrology

## 2013-08-18 DIAGNOSIS — D638 Anemia in other chronic diseases classified elsewhere: Secondary | ICD-10-CM | POA: Insufficient documentation

## 2013-08-18 DIAGNOSIS — N183 Chronic kidney disease, stage 3 unspecified: Secondary | ICD-10-CM | POA: Insufficient documentation

## 2013-08-18 LAB — POCT HEMOGLOBIN-HEMACUE: Hemoglobin: 8.8 g/dL — ABNORMAL LOW (ref 13.0–17.0)

## 2013-08-18 MED ORDER — CLONIDINE HCL 0.1 MG PO TABS: 0.1000 mg | ORAL_TABLET | ORAL | Status: DC | PRN

## 2013-08-18 MED ORDER — EPOETIN ALFA 10000 UNIT/ML IJ SOLN: 30000.0000 [IU] | INTRAMUSCULAR | Status: DC

## 2013-08-18 MED ORDER — EPOETIN ALFA 10000 UNIT/ML IJ SOLN
INTRAMUSCULAR | Status: AC
  Administered 2013-08-18: 10000 [IU] via SUBCUTANEOUS
  Filled 2013-08-18: qty 1

## 2013-08-18 MED ORDER — EPOETIN ALFA 20000 UNIT/ML IJ SOLN
INTRAMUSCULAR | Status: AC
  Administered 2013-08-18: 20000 [IU] via SUBCUTANEOUS
  Filled 2013-08-18: qty 1

## 2013-08-26 ENCOUNTER — Encounter (HOSPITAL_COMMUNITY)
Admission: RE | Admit: 2013-08-26 | Discharge: 2013-08-26 | Disposition: A | Discharge status: Home / Self Care | Payer: Medicare Other | Source: Ambulatory Visit | Attending: Nephrology | Admitting: Nephrology

## 2013-08-26 DIAGNOSIS — N183 Chronic kidney disease, stage 3 unspecified: Secondary | ICD-10-CM | POA: Insufficient documentation

## 2013-08-26 DIAGNOSIS — D638 Anemia in other chronic diseases classified elsewhere: Secondary | ICD-10-CM | POA: Insufficient documentation

## 2013-08-26 LAB — RENAL FUNCTION PANEL
BUN: 24 mg/dL — ABNORMAL HIGH (ref 6–23)
CO2: 23 mEq/L (ref 19–32)
Chloride: 100 mEq/L (ref 96–112)
Creatinine, Ser: 1.89 mg/dL — ABNORMAL HIGH (ref 0.50–1.35)
GFR calc non Af Amer: 33 mL/min — ABNORMAL LOW (ref >90)
Potassium: 4.3 mEq/L (ref 3.5–5.1)

## 2013-08-26 MED ORDER — EPOETIN ALFA 10000 UNIT/ML IJ SOLN
INTRAMUSCULAR | Status: AC
  Administered 2013-08-26: 10000 [IU] via SUBCUTANEOUS
  Filled 2013-08-26: qty 1

## 2013-08-26 MED ORDER — EPOETIN ALFA 20000 UNIT/ML IJ SOLN
INTRAMUSCULAR | Status: AC
  Administered 2013-08-26: 13:00:00 20000 [IU] via SUBCUTANEOUS
  Filled 2013-08-26: qty 1

## 2013-08-26 MED ORDER — EPOETIN ALFA 10000 UNIT/ML IJ SOLN: 30000.0000 [IU] | INTRAMUSCULAR | Status: DC

## 2013-08-26 MED ORDER — CLONIDINE HCL 0.1 MG PO TABS
0.1000 mg | ORAL_TABLET | ORAL | Status: DC | PRN
Start: 2013-08-26 — End: 2013-08-27

## 2013-09-02 ENCOUNTER — Encounter (HOSPITAL_COMMUNITY)
Admission: RE | Admit: 2013-09-02 | Discharge: 2013-09-02 | Disposition: A | Discharge status: Home / Self Care | Payer: Medicare Other | Source: Ambulatory Visit | Attending: Nephrology | Admitting: Nephrology

## 2013-09-02 DIAGNOSIS — N183 Chronic kidney disease, stage 3 unspecified: Secondary | ICD-10-CM | POA: Insufficient documentation

## 2013-09-02 DIAGNOSIS — D638 Anemia in other chronic diseases classified elsewhere: Secondary | ICD-10-CM | POA: Insufficient documentation

## 2013-09-02 LAB — IRON AND TIBC
Iron: 23 ug/dL — ABNORMAL LOW (ref 42–135)
Saturation Ratios: 17 % — ABNORMAL LOW (ref 20–55)
UIBC: 115 ug/dL — ABNORMAL LOW (ref 125–400)

## 2013-09-02 LAB — FERRITIN: Ferritin: 884 ng/mL — ABNORMAL HIGH (ref 22–322)

## 2013-09-02 LAB — POCT HEMOGLOBIN-HEMACUE: Hemoglobin: 8.9 g/dL — ABNORMAL LOW (ref 13.0–17.0)

## 2013-09-02 MED ORDER — EPOETIN ALFA 10000 UNIT/ML IJ SOLN
INTRAMUSCULAR | Status: AC
  Administered 2013-09-02: 10:00:00 10000 [IU]
  Filled 2013-09-02: qty 1

## 2013-09-02 MED ORDER — EPOETIN ALFA 20000 UNIT/ML IJ SOLN
INTRAMUSCULAR | Status: AC
  Administered 2013-09-02: 20000 [IU]
  Filled 2013-09-02: qty 1

## 2013-09-02 MED ORDER — EPOETIN ALFA 10000 UNIT/ML IJ SOLN: 30000.0000 [IU] | INTRAMUSCULAR | Status: DC

## 2013-09-02 MED ORDER — CLONIDINE HCL 0.1 MG PO TABS: 0.1000 mg | ORAL_TABLET | ORAL | Status: DC | PRN

## 2013-09-08 ENCOUNTER — Other Ambulatory Visit (HOSPITAL_COMMUNITY): Payer: Self-pay | Admitting: *Deleted

## 2013-09-09 ENCOUNTER — Encounter (HOSPITAL_COMMUNITY)
Admission: RE | Admit: 2013-09-09 | Discharge: 2013-09-09 | Disposition: A | Discharge status: Home / Self Care | Payer: Medicare Other | Source: Ambulatory Visit | Attending: Nephrology | Admitting: Nephrology

## 2013-09-09 MED ORDER — EPOETIN ALFA 10000 UNIT/ML IJ SOLN
INTRAMUSCULAR | Status: AC
  Administered 2013-09-09: 10000 [IU] via SUBCUTANEOUS
  Filled 2013-09-09: qty 1

## 2013-09-09 MED ORDER — EPOETIN ALFA 20000 UNIT/ML IJ SOLN
INTRAMUSCULAR | Status: AC
  Administered 2013-09-09: 13:00:00 20000 [IU] via SUBCUTANEOUS
  Filled 2013-09-09: qty 1

## 2013-09-09 MED ORDER — EPOETIN ALFA 10000 UNIT/ML IJ SOLN: 30000.0000 [IU] | INTRAMUSCULAR | Status: DC

## 2013-09-09 MED ORDER — SODIUM CHLORIDE 0.9 % IV SOLN
1020.0000 mg | Freq: Once | INTRAVENOUS | Status: AC
  Administered 2013-09-09: 1020 mg via INTRAVENOUS
  Filled 2013-09-09: qty 34

## 2013-09-09 MED ORDER — CLONIDINE HCL 0.1 MG PO TABS: 0.1000 mg | ORAL_TABLET | ORAL | Status: DC | PRN

## 2013-09-20 ENCOUNTER — Encounter (HOSPITAL_COMMUNITY)
Admission: RE | Admit: 2013-09-20 | Discharge: 2013-09-20 | Disposition: A | Discharge status: Home / Self Care | Payer: Medicare Other | Source: Ambulatory Visit | Attending: Nephrology | Admitting: Nephrology

## 2013-09-20 LAB — POCT HEMOGLOBIN-HEMACUE: Hemoglobin: 10.5 g/dL — ABNORMAL LOW (ref 13.0–17.0)

## 2013-09-20 LAB — RENAL FUNCTION PANEL
Albumin: 2.7 g/dL — ABNORMAL LOW (ref 3.5–5.2)
BUN: 29 mg/dL — ABNORMAL HIGH (ref 6–23)
Calcium: 8.5 mg/dL (ref 8.4–10.5)
Creatinine, Ser: 1.95 mg/dL — ABNORMAL HIGH (ref 0.50–1.35)
GFR calc Af Amer: 37 mL/min — ABNORMAL LOW (ref >90)
Glucose, Bld: 87 mg/dL (ref 70–99)
Phosphorus: 3 mg/dL (ref 2.3–4.6)
Potassium: 4.2 mEq/L (ref 3.5–5.1)
Sodium: 134 mEq/L — ABNORMAL LOW (ref 135–145)

## 2013-09-20 MED ORDER — EPOETIN ALFA 20000 UNIT/ML IJ SOLN
INTRAMUSCULAR | Status: AC
  Administered 2013-09-20: 16:00:00 20000 [IU]
  Filled 2013-09-20: qty 1

## 2013-09-20 MED ORDER — CLONIDINE HCL 0.1 MG PO TABS: 0.1000 mg | ORAL_TABLET | ORAL | Status: DC | PRN

## 2013-09-20 MED ORDER — EPOETIN ALFA 10000 UNIT/ML IJ SOLN
30000.0000 [IU] | INTRAMUSCULAR | Status: DC
  Administered 2013-09-20: 10000 [IU] via SUBCUTANEOUS

## 2013-09-20 MED ORDER — EPOETIN ALFA 10000 UNIT/ML IJ SOLN
INTRAMUSCULAR | Status: AC
  Administered 2013-09-20: 10000 [IU] via SUBCUTANEOUS
  Filled 2013-09-20: qty 1

## 2013-09-29 ENCOUNTER — Encounter (HOSPITAL_COMMUNITY)
Admission: RE | Admit: 2013-09-29 | Discharge: 2013-09-29 | Disposition: A | Discharge status: Home / Self Care | Payer: Medicare Other | Source: Ambulatory Visit | Attending: Nephrology | Admitting: Nephrology

## 2013-09-29 DIAGNOSIS — N183 Chronic kidney disease, stage 3 unspecified: Secondary | ICD-10-CM | POA: Insufficient documentation

## 2013-09-29 DIAGNOSIS — D638 Anemia in other chronic diseases classified elsewhere: Secondary | ICD-10-CM | POA: Insufficient documentation

## 2013-09-29 LAB — IRON AND TIBC
IRON: 29 ug/dL — AB (ref 42–135)
Saturation Ratios: 22 % (ref 20–55)
TIBC: 130 ug/dL — AB (ref 215–435)
UIBC: 101 ug/dL — AB (ref 125–400)

## 2013-09-29 LAB — POCT HEMOGLOBIN-HEMACUE: HEMOGLOBIN: 12 g/dL — AB (ref 13.0–17.0)

## 2013-09-29 LAB — FERRITIN: FERRITIN: 1266 ng/mL — AB (ref 22–322)

## 2013-09-29 MED ORDER — CLONIDINE HCL 0.1 MG PO TABS: 0.1000 mg | ORAL_TABLET | ORAL | Status: DC | PRN

## 2013-09-29 MED ORDER — EPOETIN ALFA 10000 UNIT/ML IJ SOLN: 30000.0000 [IU] | INTRAMUSCULAR | Status: DC

## 2013-10-13 ENCOUNTER — Encounter (HOSPITAL_COMMUNITY)
Admission: RE | Admit: 2013-10-13 | Discharge: 2013-10-13 | Disposition: A | Discharge status: Home / Self Care | Payer: Medicare Other | Source: Ambulatory Visit | Attending: Nephrology | Admitting: Nephrology

## 2013-10-13 LAB — POCT HEMOGLOBIN-HEMACUE: HEMOGLOBIN: 11.7 g/dL — AB (ref 13.0–17.0)

## 2013-10-13 MED ORDER — EPOETIN ALFA 10000 UNIT/ML IJ SOLN: 30000.0000 [IU] | INTRAMUSCULAR | Status: DC

## 2013-10-13 MED ORDER — CLONIDINE HCL 0.1 MG PO TABS: 0.1000 mg | ORAL_TABLET | ORAL | Status: DC | PRN

## 2013-10-26 ENCOUNTER — Other Ambulatory Visit (HOSPITAL_COMMUNITY): Payer: Self-pay

## 2013-10-27 ENCOUNTER — Encounter (HOSPITAL_COMMUNITY)
Admission: RE | Admit: 2013-10-27 | Discharge: 2013-10-27 | Disposition: A | Discharge status: Home / Self Care | Payer: Medicare Other | Source: Ambulatory Visit | Attending: Nephrology | Admitting: Nephrology

## 2013-10-27 DIAGNOSIS — N183 Chronic kidney disease, stage 3 unspecified: Secondary | ICD-10-CM | POA: Insufficient documentation

## 2013-10-27 DIAGNOSIS — D638 Anemia in other chronic diseases classified elsewhere: Secondary | ICD-10-CM | POA: Insufficient documentation

## 2013-10-27 LAB — RENAL FUNCTION PANEL
ALBUMIN: 2.7 g/dL — AB (ref 3.5–5.2)
BUN: 25 mg/dL — ABNORMAL HIGH (ref 6–23)
CO2: 20 meq/L (ref 19–32)
Calcium: 8.9 mg/dL (ref 8.4–10.5)
Chloride: 104 mEq/L (ref 96–112)
Creatinine, Ser: 1.78 mg/dL — ABNORMAL HIGH (ref 0.50–1.35)
GFR calc Af Amer: 42 mL/min — ABNORMAL LOW (ref >90)
GFR calc non Af Amer: 36 mL/min — ABNORMAL LOW (ref >90)
Glucose, Bld: 102 mg/dL — ABNORMAL HIGH (ref 70–99)
POTASSIUM: 4.2 meq/L (ref 3.7–5.3)
Phosphorus: 2.4 mg/dL (ref 2.3–4.6)
Sodium: 137 mEq/L (ref 137–147)

## 2013-10-27 LAB — FERRITIN: FERRITIN: 1960 ng/mL — AB (ref 22–322)

## 2013-10-27 LAB — IRON AND TIBC
Iron: 34 ug/dL — ABNORMAL LOW (ref 42–135)
Saturation Ratios: 26 % (ref 20–55)
TIBC: 133 ug/dL — ABNORMAL LOW (ref 215–435)
UIBC: 99 ug/dL — ABNORMAL LOW (ref 125–400)

## 2013-10-27 MED ORDER — EPOETIN ALFA 10000 UNIT/ML IJ SOLN: 20000.0000 [IU] | INTRAMUSCULAR | Status: DC

## 2013-10-27 MED ORDER — EPOETIN ALFA 20000 UNIT/ML IJ SOLN
INTRAMUSCULAR | Status: AC
  Administered 2013-10-27: 20000 [IU]
  Filled 2013-10-27: qty 1

## 2013-10-27 MED ORDER — CLONIDINE HCL 0.1 MG PO TABS: 0.1000 mg | ORAL_TABLET | ORAL | Status: DC | PRN

## 2013-10-28 LAB — POCT HEMOGLOBIN-HEMACUE: Hemoglobin: 11.2 g/dL — ABNORMAL LOW (ref 13.0–17.0)

## 2013-11-03 ENCOUNTER — Encounter (HOSPITAL_COMMUNITY)
Admission: RE | Admit: 2013-11-03 | Discharge: 2013-11-03 | Disposition: A | Discharge status: Home / Self Care | Payer: Medicare Other | Source: Ambulatory Visit | Attending: Nephrology | Admitting: Nephrology

## 2013-11-03 LAB — POCT HEMOGLOBIN-HEMACUE: Hemoglobin: 11.3 g/dL — ABNORMAL LOW (ref 13.0–17.0)

## 2013-11-03 MED ORDER — EPOETIN ALFA 20000 UNIT/ML IJ SOLN
INTRAMUSCULAR | Status: AC
  Administered 2013-11-03: 20000 [IU] via SUBCUTANEOUS
  Filled 2013-11-03: qty 1

## 2013-11-03 MED ORDER — EPOETIN ALFA 10000 UNIT/ML IJ SOLN: 20000.0000 [IU] | INTRAMUSCULAR | Status: DC

## 2013-11-10 ENCOUNTER — Encounter (HOSPITAL_COMMUNITY)
Admission: RE | Admit: 2013-11-10 | Discharge: 2013-11-10 | Disposition: A | Discharge status: Home / Self Care | Payer: Medicare Other | Source: Ambulatory Visit | Attending: Nephrology | Admitting: Nephrology

## 2013-11-10 LAB — POCT HEMOGLOBIN-HEMACUE: HEMOGLOBIN: 11 g/dL — AB (ref 13.0–17.0)

## 2013-11-10 MED ORDER — CLONIDINE HCL 0.1 MG PO TABS: 0.1000 mg | ORAL_TABLET | ORAL | Status: DC | PRN

## 2013-11-10 MED ORDER — EPOETIN ALFA 20000 UNIT/ML IJ SOLN
INTRAMUSCULAR | Status: AC
  Administered 2013-11-10: 20000 [IU] via SUBCUTANEOUS
  Filled 2013-11-10: qty 1

## 2013-11-10 MED ORDER — EPOETIN ALFA 10000 UNIT/ML IJ SOLN: 20000.0000 [IU] | INTRAMUSCULAR | Status: DC

## 2013-11-18 ENCOUNTER — Inpatient Hospital Stay (HOSPITAL_COMMUNITY): Admission: RE | Admit: 2013-11-18 | Payer: Medicare Other | Source: Ambulatory Visit

## 2013-11-22 ENCOUNTER — Encounter (HOSPITAL_COMMUNITY)
Admission: RE | Admit: 2013-11-22 | Discharge: 2013-11-22 | Disposition: A | Discharge status: Home / Self Care | Payer: Medicare Other | Source: Ambulatory Visit | Attending: Nephrology | Admitting: Nephrology

## 2013-11-22 DIAGNOSIS — N183 Chronic kidney disease, stage 3 unspecified: Secondary | ICD-10-CM | POA: Insufficient documentation

## 2013-11-22 DIAGNOSIS — D638 Anemia in other chronic diseases classified elsewhere: Secondary | ICD-10-CM | POA: Insufficient documentation

## 2013-11-22 LAB — IRON AND TIBC
IRON: 19 ug/dL — AB (ref 42–135)
Saturation Ratios: 15 % — ABNORMAL LOW (ref 20–55)
TIBC: 128 ug/dL — ABNORMAL LOW (ref 215–435)
UIBC: 109 ug/dL — ABNORMAL LOW (ref 125–400)

## 2013-11-22 LAB — RENAL FUNCTION PANEL
ALBUMIN: 2.5 g/dL — AB (ref 3.5–5.2)
BUN: 22 mg/dL (ref 6–23)
CHLORIDE: 102 meq/L (ref 96–112)
CO2: 21 mEq/L (ref 19–32)
CREATININE: 1.94 mg/dL — AB (ref 0.50–1.35)
Calcium: 8.6 mg/dL (ref 8.4–10.5)
GFR calc Af Amer: 37 mL/min — ABNORMAL LOW (ref >90)
GFR calc non Af Amer: 32 mL/min — ABNORMAL LOW (ref >90)
Glucose, Bld: 148 mg/dL — ABNORMAL HIGH (ref 70–99)
Phosphorus: 2.8 mg/dL (ref 2.3–4.6)
Potassium: 4.6 mEq/L (ref 3.7–5.3)
Sodium: 137 mEq/L (ref 137–147)

## 2013-11-22 LAB — FERRITIN: Ferritin: 1448 ng/mL — ABNORMAL HIGH (ref 22–322)

## 2013-11-22 LAB — POCT HEMOGLOBIN-HEMACUE: Hemoglobin: 11.3 g/dL — ABNORMAL LOW (ref 13.0–17.0)

## 2013-11-22 MED ORDER — EPOETIN ALFA 10000 UNIT/ML IJ SOLN
20000.0000 [IU] | INTRAMUSCULAR | Status: DC
  Administered 2013-11-22: 20000 [IU] via SUBCUTANEOUS

## 2013-11-22 MED ORDER — CLONIDINE HCL 0.1 MG PO TABS: 0.1000 mg | ORAL_TABLET | ORAL | Status: DC | PRN

## 2013-11-22 MED ORDER — EPOETIN ALFA 20000 UNIT/ML IJ SOLN
INTRAMUSCULAR | Status: AC
  Filled 2013-11-22: qty 1

## 2013-11-23 MED FILL — Epoetin Alfa Inj 20000 Unit/ML: INTRAMUSCULAR | Qty: 1 | Status: AC

## 2013-11-29 ENCOUNTER — Encounter (HOSPITAL_COMMUNITY)
Admission: RE | Admit: 2013-11-29 | Discharge: 2013-11-29 | Disposition: A | Discharge status: Home / Self Care | Payer: Medicare Other | Source: Ambulatory Visit | Attending: Nephrology | Admitting: Nephrology

## 2013-11-29 LAB — POCT HEMOGLOBIN-HEMACUE: Hemoglobin: 11.9 g/dL — ABNORMAL LOW (ref 13.0–17.0)

## 2013-11-29 MED ORDER — CLONIDINE HCL 0.1 MG PO TABS: 0.1000 mg | ORAL_TABLET | ORAL | Status: DC | PRN

## 2013-11-29 MED ORDER — EPOETIN ALFA 10000 UNIT/ML IJ SOLN: 20000.0000 [IU] | INTRAMUSCULAR | Status: DC

## 2013-12-15 ENCOUNTER — Encounter (HOSPITAL_COMMUNITY)
Admission: RE | Admit: 2013-12-15 | Discharge: 2013-12-15 | Disposition: A | Discharge status: Home / Self Care | Payer: Medicare Other | Source: Ambulatory Visit | Attending: Nephrology | Admitting: Nephrology

## 2013-12-15 LAB — POCT HEMOGLOBIN-HEMACUE: HEMOGLOBIN: 11.1 g/dL — AB (ref 13.0–17.0)

## 2013-12-15 MED ORDER — EPOETIN ALFA 10000 UNIT/ML IJ SOLN: 20000.0000 [IU] | INTRAMUSCULAR | Status: DC

## 2013-12-15 MED ORDER — CLONIDINE HCL 0.1 MG PO TABS: 0.1000 mg | ORAL_TABLET | ORAL | Status: DC | PRN

## 2013-12-15 MED ORDER — EPOETIN ALFA 20000 UNIT/ML IJ SOLN
INTRAMUSCULAR | Status: AC
  Administered 2013-12-15: 20000 [IU] via SUBCUTANEOUS
  Filled 2013-12-15: qty 1

## 2013-12-22 ENCOUNTER — Encounter (HOSPITAL_COMMUNITY)
Admission: RE | Admit: 2013-12-22 | Discharge: 2013-12-22 | Disposition: A | Discharge status: Home / Self Care | Payer: Medicare Other | Source: Ambulatory Visit | Attending: Nephrology | Admitting: Nephrology

## 2013-12-22 DIAGNOSIS — N183 Chronic kidney disease, stage 3 unspecified: Secondary | ICD-10-CM | POA: Diagnosis not present

## 2013-12-22 DIAGNOSIS — D638 Anemia in other chronic diseases classified elsewhere: Secondary | ICD-10-CM | POA: Insufficient documentation

## 2013-12-22 LAB — IRON AND TIBC
IRON: 20 ug/dL — AB (ref 42–135)
SATURATION RATIOS: 14 % — AB (ref 20–55)
TIBC: 145 ug/dL — ABNORMAL LOW (ref 215–435)
UIBC: 125 ug/dL (ref 125–400)

## 2013-12-22 LAB — RENAL FUNCTION PANEL
Albumin: 2.7 g/dL — ABNORMAL LOW (ref 3.5–5.2)
BUN: 29 mg/dL — AB (ref 6–23)
CALCIUM: 8.7 mg/dL (ref 8.4–10.5)
CHLORIDE: 92 meq/L — AB (ref 96–112)
CO2: 27 meq/L (ref 19–32)
CREATININE: 2.82 mg/dL — AB (ref 0.50–1.35)
GFR calc Af Amer: 24 mL/min — ABNORMAL LOW (ref >90)
GFR calc non Af Amer: 21 mL/min — ABNORMAL LOW (ref >90)
Glucose, Bld: 242 mg/dL — ABNORMAL HIGH (ref 70–99)
Phosphorus: 2.1 mg/dL — ABNORMAL LOW (ref 2.3–4.6)
Potassium: 3.1 mEq/L — ABNORMAL LOW (ref 3.7–5.3)
Sodium: 135 mEq/L — ABNORMAL LOW (ref 137–147)

## 2013-12-22 LAB — FERRITIN: Ferritin: 2007 ng/mL — ABNORMAL HIGH (ref 22–322)

## 2013-12-22 LAB — POCT HEMOGLOBIN-HEMACUE: HEMOGLOBIN: 11 g/dL — AB (ref 13.0–17.0)

## 2013-12-22 MED ORDER — EPOETIN ALFA 10000 UNIT/ML IJ SOLN: 20000.0000 [IU] | INTRAMUSCULAR | Status: DC

## 2013-12-22 MED ORDER — EPOETIN ALFA 20000 UNIT/ML IJ SOLN
INTRAMUSCULAR | Status: AC
  Administered 2013-12-22: 20000 [IU]
  Filled 2013-12-22: qty 1

## 2013-12-29 ENCOUNTER — Encounter (HOSPITAL_COMMUNITY)
Admission: RE | Admit: 2013-12-29 | Discharge: 2013-12-29 | Disposition: A | Discharge status: Home / Self Care | Payer: Medicare Other | Source: Ambulatory Visit | Attending: Nephrology | Admitting: Nephrology

## 2013-12-29 DIAGNOSIS — D638 Anemia in other chronic diseases classified elsewhere: Secondary | ICD-10-CM | POA: Diagnosis not present

## 2013-12-29 LAB — POCT HEMOGLOBIN-HEMACUE: HEMOGLOBIN: 9.8 g/dL — AB (ref 13.0–17.0)

## 2013-12-29 MED ORDER — EPOETIN ALFA 10000 UNIT/ML IJ SOLN: 20000.0000 [IU] | INTRAMUSCULAR | Status: DC

## 2013-12-29 MED ORDER — CLONIDINE HCL 0.1 MG PO TABS: 0.1000 mg | ORAL_TABLET | ORAL | Status: DC | PRN

## 2013-12-29 MED ORDER — EPOETIN ALFA 20000 UNIT/ML IJ SOLN
INTRAMUSCULAR | Status: AC
  Administered 2013-12-29: 20000 [IU] via SUBCUTANEOUS
  Filled 2013-12-29: qty 1

## 2014-01-05 ENCOUNTER — Encounter (HOSPITAL_COMMUNITY)
Admission: RE | Admit: 2014-01-05 | Discharge: 2014-01-05 | Disposition: A | Discharge status: Home / Self Care | Payer: Medicare Other | Source: Ambulatory Visit | Attending: Nephrology | Admitting: Nephrology

## 2014-01-05 DIAGNOSIS — D638 Anemia in other chronic diseases classified elsewhere: Secondary | ICD-10-CM | POA: Diagnosis not present

## 2014-01-05 LAB — POCT HEMOGLOBIN-HEMACUE: Hemoglobin: 10.7 g/dL — ABNORMAL LOW (ref 13.0–17.0)

## 2014-01-05 MED ORDER — EPOETIN ALFA 10000 UNIT/ML IJ SOLN: 20000.0000 [IU] | INTRAMUSCULAR | Status: DC

## 2014-01-05 MED ORDER — EPOETIN ALFA 20000 UNIT/ML IJ SOLN
INTRAMUSCULAR | Status: AC
  Administered 2014-01-05: 20000 [IU] via SUBCUTANEOUS
  Filled 2014-01-05: qty 1

## 2014-01-12 ENCOUNTER — Encounter (HOSPITAL_COMMUNITY)
Admission: RE | Admit: 2014-01-12 | Discharge: 2014-01-12 | Disposition: A | Discharge status: Home / Self Care | Payer: Medicare Other | Source: Ambulatory Visit | Attending: Nephrology | Admitting: Nephrology

## 2014-01-12 DIAGNOSIS — D638 Anemia in other chronic diseases classified elsewhere: Secondary | ICD-10-CM | POA: Diagnosis not present

## 2014-01-12 LAB — POCT HEMOGLOBIN-HEMACUE: Hemoglobin: 10.6 g/dL — ABNORMAL LOW (ref 13.0–17.0)

## 2014-01-12 MED ORDER — EPOETIN ALFA 20000 UNIT/ML IJ SOLN
INTRAMUSCULAR | Status: AC
  Administered 2014-01-12: 20000 [IU] via SUBCUTANEOUS
  Filled 2014-01-12: qty 1

## 2014-01-12 MED ORDER — EPOETIN ALFA 10000 UNIT/ML IJ SOLN: 20000.0000 [IU] | INTRAMUSCULAR | Status: DC

## 2014-01-18 ENCOUNTER — Encounter (HOSPITAL_COMMUNITY): Payer: Self-pay | Admitting: Emergency Medicine

## 2014-01-18 ENCOUNTER — Emergency Department (HOSPITAL_COMMUNITY)
Admission: EM | Admit: 2014-01-18 | Discharge: 2014-01-18 | Discharge status: Against Medical Advice | Payer: Medicare Other | Source: Home / Self Care | Attending: Emergency Medicine | Admitting: Emergency Medicine

## 2014-01-18 ENCOUNTER — Encounter (HOSPITAL_COMMUNITY)
Admission: RE | Admit: 2014-01-18 | Discharge: 2014-01-18 | Disposition: A | Discharge status: Home / Self Care | Payer: Medicare Other | Source: Ambulatory Visit | Attending: Nephrology | Admitting: Nephrology

## 2014-01-18 DIAGNOSIS — N189 Chronic kidney disease, unspecified: Secondary | ICD-10-CM

## 2014-01-18 DIAGNOSIS — N179 Acute kidney failure, unspecified: Secondary | ICD-10-CM | POA: Insufficient documentation

## 2014-01-18 DIAGNOSIS — E876 Hypokalemia: Secondary | ICD-10-CM | POA: Insufficient documentation

## 2014-01-18 DIAGNOSIS — E871 Hypo-osmolality and hyponatremia: Secondary | ICD-10-CM | POA: Diagnosis present

## 2014-01-18 DIAGNOSIS — I129 Hypertensive chronic kidney disease with stage 1 through stage 4 chronic kidney disease, or unspecified chronic kidney disease: Secondary | ICD-10-CM | POA: Diagnosis present

## 2014-01-18 DIAGNOSIS — Z681 Body mass index (BMI) 19 or less, adult: Secondary | ICD-10-CM

## 2014-01-18 DIAGNOSIS — I509 Heart failure, unspecified: Secondary | ICD-10-CM | POA: Diagnosis present

## 2014-01-18 DIAGNOSIS — I959 Hypotension, unspecified: Secondary | ICD-10-CM | POA: Diagnosis present

## 2014-01-18 DIAGNOSIS — H269 Unspecified cataract: Secondary | ICD-10-CM

## 2014-01-18 DIAGNOSIS — Z941 Heart transplant status: Secondary | ICD-10-CM

## 2014-01-18 DIAGNOSIS — R64 Cachexia: Secondary | ICD-10-CM | POA: Diagnosis present

## 2014-01-18 DIAGNOSIS — N390 Urinary tract infection, site not specified: Secondary | ICD-10-CM | POA: Diagnosis present

## 2014-01-18 DIAGNOSIS — I2510 Atherosclerotic heart disease of native coronary artery without angina pectoris: Secondary | ICD-10-CM

## 2014-01-18 DIAGNOSIS — R5381 Other malaise: Secondary | ICD-10-CM | POA: Diagnosis not present

## 2014-01-18 DIAGNOSIS — J40 Bronchitis, not specified as acute or chronic: Secondary | ICD-10-CM | POA: Diagnosis present

## 2014-01-18 DIAGNOSIS — R9431 Abnormal electrocardiogram [ECG] [EKG]: Secondary | ICD-10-CM | POA: Diagnosis present

## 2014-01-18 DIAGNOSIS — I252 Old myocardial infarction: Secondary | ICD-10-CM

## 2014-01-18 DIAGNOSIS — N183 Chronic kidney disease, stage 3 unspecified: Secondary | ICD-10-CM | POA: Diagnosis present

## 2014-01-18 DIAGNOSIS — E43 Unspecified severe protein-calorie malnutrition: Secondary | ICD-10-CM | POA: Diagnosis present

## 2014-01-18 DIAGNOSIS — F17200 Nicotine dependence, unspecified, uncomplicated: Secondary | ICD-10-CM | POA: Diagnosis present

## 2014-01-18 DIAGNOSIS — Z79899 Other long term (current) drug therapy: Secondary | ICD-10-CM

## 2014-01-18 DIAGNOSIS — D509 Iron deficiency anemia, unspecified: Secondary | ICD-10-CM | POA: Diagnosis present

## 2014-01-18 LAB — RENAL FUNCTION PANEL
Albumin: 2.8 g/dL — ABNORMAL LOW (ref 3.5–5.2)
BUN: 47 mg/dL — ABNORMAL HIGH (ref 6–23)
CALCIUM: 9.3 mg/dL (ref 8.4–10.5)
CO2: 29 meq/L (ref 19–32)
CREATININE: 3.05 mg/dL — AB (ref 0.50–1.35)
Chloride: 85 mEq/L — ABNORMAL LOW (ref 96–112)
GFR calc Af Amer: 22 mL/min — ABNORMAL LOW (ref >90)
GFR calc non Af Amer: 19 mL/min — ABNORMAL LOW (ref >90)
GLUCOSE: 192 mg/dL — AB (ref 70–99)
Phosphorus: 2.3 mg/dL (ref 2.3–4.6)
Potassium: 2.5 mEq/L — CL (ref 3.7–5.3)
Sodium: 133 mEq/L — ABNORMAL LOW (ref 137–147)

## 2014-01-18 MED ORDER — EPOETIN ALFA 20000 UNIT/ML IJ SOLN
INTRAMUSCULAR | Status: AC
  Administered 2014-01-18: 20000 [IU] via SUBCUTANEOUS
  Filled 2014-01-18: qty 1

## 2014-01-18 MED ORDER — POTASSIUM CHLORIDE 10 MEQ/100ML IV SOLN: 10.0000 meq | INTRAVENOUS | Status: DC

## 2014-01-18 MED ORDER — EPOETIN ALFA 10000 UNIT/ML IJ SOLN: 20000.0000 [IU] | INTRAMUSCULAR | Status: DC

## 2014-01-18 NOTE — ED Provider Notes (Addendum)
CSN: [id number]     Arrival date & time 01/18/14  1337 History   First MD Initiated Contact with Patient 01/18/14 1338     Chief Complaint  Patient presents with  . Altered Mental Status     (Consider location/radiation/quality/duration/timing/severity/associated sxs/prior Treatment) HPI  This is a 73 year old with a history of renal disease, coronary artery disease, CHF, and hypertension who presents from short stay with concerns for altered mental status. Patient is awake, alert, and oriented. Per the nurse in short stay, she felt the patient was acting "oddly." She states over the last 3-4 months he has had progressive decline in his health. Labs were sent from short stay and notable for potassium of 2.4. Patient denies any pain. He is uncooperative with history taking and states "I just want to get my car and go home."  Patient denies any SI or HI.  Spoke to patient's wife on the phone. She has been living in [place] for the last 3 months. She states that the patient is depressed and has had health decline recently. She states that she's concerned about him.  Past Medical History  Diagnosis Date  . Renal disorder   . Coronary artery disease   . CHF (congestive heart failure)   . Hypertension    Past Surgical History  Procedure Laterality Date  . Heart transplant     No family history on file. History  Substance Use Topics  . Smoking status: Current Every Day Smoker  . Smokeless tobacco: Not on file  . Alcohol Use: No    Review of Systems  Unable to perform ROS: Other      Allergies  Review of patient's allergies indicates no known allergies.  Home Medications   Prior to Admission medications   Medication Sig Start Date End Date Taking? Authorizing Provider  carvedilol (COREG) 12.5 MG tablet Take 12.5 mg by mouth 2 (two) times daily with a meal.    Historical Provider, MD  docusate sodium (COLACE) 100 MG capsule Take 100 mg by mouth daily.    Historical Provider,  MD  Multiple Vitamin (MULITIVITAMIN WITH MINERALS) TABS Take 1 tablet by mouth daily.    Historical Provider, MD  mycophenolate (CELLCEPT) 500 MG tablet Take 500 mg by mouth 2 (two) times daily.    Historical Provider, MD  pravastatin (PRAVACHOL) 20 MG tablet Take 20 mg by mouth daily.    Historical Provider, MD  ranitidine (ZANTAC) 150 MG tablet Take 150 mg by mouth 2 (two) times daily.    Historical Provider, MD  sirolimus (RAPAMUNE) 2 MG tablet Take 2 mg by mouth daily.    Historical Provider, MD  tolterodine (DETROL LA) 2 MG 24 hr capsule Take 2 mg by mouth daily.    Historical Provider, MD   BP 120/74  Pulse 79  Temp(Src) 97.9 F (36.6 C) (Oral)  Resp 20  SpO2 99% Physical Exam  Nursing note and vitals reviewed. Constitutional: He is oriented to person, place, and time.  Chronically ill-appearing, thin  HENT:  Head: Normocephalic and atraumatic.  Mucous membranes dry  Eyes: Pupils are equal, round, and reactive to light.  Cataract noted on the right with dilated right pupil, left pupil 4 mm and reactive  Neck: Neck supple.  Cardiovascular: Normal rate and regular rhythm.   Pulmonary/Chest: Effort normal. No respiratory distress.  Abdominal: Soft. He exhibits no distension. There is no tenderness.  Cachectic, midline abdominal scar  Musculoskeletal: He exhibits no edema.  Lymphadenopathy:  He has no cervical adenopathy.  Neurological: He is alert and oriented to person, place, and time.    Moves all 4 extremities, follows commands, otherwise refusing further neurologic testing  Skin: Skin is warm and dry.  Psychiatric:  Strange affect    ED Course  Procedures (including critical care time) Labs Review Labs Reviewed  CBC WITH DIFFERENTIAL  URINALYSIS, ROUTINE W REFLEX MICROSCOPIC  COMPREHENSIVE METABOLIC PANEL  I-STAT CG4 LACTIC ACID, ED    Imaging Review No results found.   EKG Interpretation None      MDM   Final diagnoses:  None    Patient  presents from short stay with concerns for altered mental status. On my exam he is awake, alert, and oriented x3. He is nonfocal on neurologic exam. He does have a strange affect. Labs sent from short stay notable for a potassium of 2.5 and an increasing creatinine with a BUN of 40.  Patient is refusing all intervention, lab draws, and EKG. Discussed with the patient that with his potassium so low and his worsening kidney function that he can go out and die from a fatal arrhythmia. Patient states understanding and states "I'm old it's okay if I die."  Patient is unwilling to stay. He appears to have capacity even given his metabolic derrangement. I have concerns that the patient will drive home and hurt someone else. I discussed with the [employer] he states that if the patient has a valid driver's license and has capacity, there are no other options. I have requested the patient's neighbor to take him home. Patient to sign out AMA.    Shon Batonourtney F Horton, MD 01/18/14 1610  Shon Batonourtney F Horton, MD 01/18/14 1946

## 2014-01-18 NOTE — ED Notes (Signed)
Pt brought down from short stay after getting a procrit injection. Report from nurse that he is not himself. [place] but he could not recognize her on the phone per staff. Staff states he has been alert but slow to answer and would get a wild look on his face.

## 2014-01-18 NOTE — ED Notes (Signed)
Dr. Horton in to see patient

## 2014-01-18 NOTE — ED Notes (Signed)
Pt trying to crawl out of bed and will not let staff get blood work. States, " you can't hold me here". Charge RN made aware and tech in to sit with patient.

## 2014-01-18 NOTE — ED Notes (Signed)
CRITICAL VALUE ALERT  Critical value received:  Potassium 2.5  Date of notification:  01/18/2014  Time of notification:  01/18/2014  Critical value read back:yes  Nurse who received alert:  MG Jenelle MagesHairston, RN  MD notified (1st page):  Dr. Wilkie AyeHorton  Time of first page:  1345  MD notified (2nd page):  Time of second page:  Responding MD:  Dr. Wilkie AyeHorton  Time MD responded:  [phone]

## 2014-01-18 NOTE — Progress Notes (Signed)
Pt came in today for injection appointment.  Pt was a day early, was slow to respond to questions although he answered questions appropriately, BP was in the 70's, hgb today 11.2, pt states he has not eaten all day, has not taken any of his medications, pt does not seem like himself.  I called and reported the above to [employer] [place] [employer]rystal Sutton, CMA of [facility] who stated if we thought he needed to be evaluated to send him to the ER.  I spoke with his wife on her cell phone, [phone], her name is Bosie Closjudith and she is currently in [place] with [place] children.  She spoke with mr Alvester MorinBell and stated he sounded terrible and agreed he should be taken to the ER.

## 2014-01-18 NOTE — ED Notes (Signed)
Dr. Wilkie AyeHorton speaking with RN from short stay.

## 2014-01-18 NOTE — ED Notes (Signed)
Friend/Neighbor showed up and will attempt to drive the pt home.

## 2014-01-18 NOTE — ED Notes (Addendum)
Dr. Wilkie AyeHorton back in with patient to discuss him leaving. Pt able to verbalize back to Dr. Wilkie AyeHorton that if he goes home he could die because his potassium is low. Pt states, "I'm old it's okay". Pt is now alert and oriented and able to discuss options with the MD. Pt does not want to stay.

## 2014-01-19 ENCOUNTER — Inpatient Hospital Stay (HOSPITAL_COMMUNITY): Admission: RE | Admit: 2014-01-19 | Payer: Medicare Other | Source: Ambulatory Visit

## 2014-01-19 ENCOUNTER — Inpatient Hospital Stay (HOSPITAL_COMMUNITY)
Admission: EM | Admit: 2014-01-19 | Discharge: 2014-01-20 | DRG: 682 | Disposition: A | Discharge status: Home / Self Care | Payer: Medicare Other | Attending: Internal Medicine | Admitting: Internal Medicine

## 2014-01-19 ENCOUNTER — Emergency Department (HOSPITAL_COMMUNITY): Payer: Medicare Other

## 2014-01-19 ENCOUNTER — Encounter (HOSPITAL_COMMUNITY): Payer: Self-pay | Admitting: Emergency Medicine

## 2014-01-19 DIAGNOSIS — I509 Heart failure, unspecified: Secondary | ICD-10-CM | POA: Diagnosis present

## 2014-01-19 DIAGNOSIS — R64 Cachexia: Secondary | ICD-10-CM | POA: Diagnosis present

## 2014-01-19 DIAGNOSIS — J40 Bronchitis, not specified as acute or chronic: Secondary | ICD-10-CM

## 2014-01-19 DIAGNOSIS — N19 Unspecified kidney failure: Secondary | ICD-10-CM

## 2014-01-19 DIAGNOSIS — Z941 Heart transplant status: Secondary | ICD-10-CM

## 2014-01-19 DIAGNOSIS — Z681 Body mass index (BMI) 19 or less, adult: Secondary | ICD-10-CM | POA: Diagnosis not present

## 2014-01-19 DIAGNOSIS — N183 Chronic kidney disease, stage 3 unspecified: Secondary | ICD-10-CM | POA: Diagnosis present

## 2014-01-19 DIAGNOSIS — D509 Iron deficiency anemia, unspecified: Secondary | ICD-10-CM | POA: Diagnosis present

## 2014-01-19 DIAGNOSIS — I129 Hypertensive chronic kidney disease with stage 1 through stage 4 chronic kidney disease, or unspecified chronic kidney disease: Secondary | ICD-10-CM | POA: Diagnosis present

## 2014-01-19 DIAGNOSIS — R5381 Other malaise: Secondary | ICD-10-CM

## 2014-01-19 DIAGNOSIS — E871 Hypo-osmolality and hyponatremia: Secondary | ICD-10-CM | POA: Diagnosis present

## 2014-01-19 DIAGNOSIS — I2510 Atherosclerotic heart disease of native coronary artery without angina pectoris: Secondary | ICD-10-CM | POA: Diagnosis present

## 2014-01-19 DIAGNOSIS — N289 Disorder of kidney and ureter, unspecified: Secondary | ICD-10-CM

## 2014-01-19 DIAGNOSIS — R5383 Other fatigue: Secondary | ICD-10-CM | POA: Diagnosis present

## 2014-01-19 DIAGNOSIS — E860 Dehydration: Secondary | ICD-10-CM | POA: Diagnosis present

## 2014-01-19 DIAGNOSIS — F17200 Nicotine dependence, unspecified, uncomplicated: Secondary | ICD-10-CM | POA: Diagnosis present

## 2014-01-19 DIAGNOSIS — I10 Essential (primary) hypertension: Secondary | ICD-10-CM | POA: Diagnosis not present

## 2014-01-19 DIAGNOSIS — E43 Unspecified severe protein-calorie malnutrition: Secondary | ICD-10-CM | POA: Diagnosis present

## 2014-01-19 DIAGNOSIS — R9431 Abnormal electrocardiogram [ECG] [EKG]: Secondary | ICD-10-CM | POA: Diagnosis present

## 2014-01-19 DIAGNOSIS — I959 Hypotension, unspecified: Secondary | ICD-10-CM

## 2014-01-19 DIAGNOSIS — R531 Weakness: Secondary | ICD-10-CM | POA: Diagnosis present

## 2014-01-19 DIAGNOSIS — N390 Urinary tract infection, site not specified: Secondary | ICD-10-CM | POA: Diagnosis present

## 2014-01-19 DIAGNOSIS — N179 Acute kidney failure, unspecified: Secondary | ICD-10-CM | POA: Diagnosis present

## 2014-01-19 DIAGNOSIS — I252 Old myocardial infarction: Secondary | ICD-10-CM | POA: Diagnosis not present

## 2014-01-19 LAB — CBC WITH DIFFERENTIAL/PLATELET
BASOS PCT: 0 % (ref 0–1)
Basophils Absolute: 0 10*3/uL (ref 0.0–0.1)
Eosinophils Absolute: 0 10*3/uL (ref 0.0–0.7)
Eosinophils Relative: 0 % (ref 0–5)
HCT: 39.5 % (ref 39.0–52.0)
Hemoglobin: 12.3 g/dL — ABNORMAL LOW (ref 13.0–17.0)
LYMPHS PCT: 6 % — AB (ref 12–46)
Lymphs Abs: 0.9 10*3/uL (ref 0.7–4.0)
MCH: 25.4 pg — ABNORMAL LOW (ref 26.0–34.0)
MCHC: 31.1 g/dL (ref 30.0–36.0)
MCV: 81.6 fL (ref 78.0–100.0)
Monocytes Absolute: 1.4 10*3/uL — ABNORMAL HIGH (ref 0.1–1.0)
Monocytes Relative: 9 % (ref 3–12)
NEUTROS ABS: 13.3 10*3/uL — AB (ref 1.7–7.7)
Neutrophils Relative %: 85 % — ABNORMAL HIGH (ref 43–77)
PLATELETS: 204 10*3/uL (ref 150–400)
RBC: 4.84 MIL/uL (ref 4.22–5.81)
RDW: 18 % — ABNORMAL HIGH (ref 11.5–15.5)
WBC: 15.6 10*3/uL — AB (ref 4.0–10.5)

## 2014-01-19 LAB — I-STAT CHEM 8, ED
BUN: 64 mg/dL — AB (ref 6–23)
Calcium, Ion: 0.99 mmol/L — ABNORMAL LOW (ref 1.13–1.30)
Chloride: 93 mEq/L — ABNORMAL LOW (ref 96–112)
Creatinine, Ser: 3.3 mg/dL — ABNORMAL HIGH (ref 0.50–1.35)
Glucose, Bld: 128 mg/dL — ABNORMAL HIGH (ref 70–99)
HCT: 42 % (ref 39.0–52.0)
HEMOGLOBIN: 14.3 g/dL (ref 13.0–17.0)
Potassium: 4.3 mEq/L (ref 3.7–5.3)
SODIUM: 131 meq/L — AB (ref 137–147)
TCO2: 33 mmol/L (ref 0–100)

## 2014-01-19 LAB — URINE MICROSCOPIC-ADD ON

## 2014-01-19 LAB — COMPREHENSIVE METABOLIC PANEL
ALBUMIN: 2.8 g/dL — AB (ref 3.5–5.2)
ALK PHOS: 105 U/L (ref 39–117)
ALT: 11 U/L (ref 0–53)
AST: 47 U/L — AB (ref 0–37)
BUN: 49 mg/dL — ABNORMAL HIGH (ref 6–23)
CO2: 29 mEq/L (ref 19–32)
Calcium: 9.4 mg/dL (ref 8.4–10.5)
Chloride: 86 mEq/L — ABNORMAL LOW (ref 96–112)
Creatinine, Ser: 3.07 mg/dL — ABNORMAL HIGH (ref 0.50–1.35)
GFR calc Af Amer: 22 mL/min — ABNORMAL LOW (ref >90)
GFR calc non Af Amer: 19 mL/min — ABNORMAL LOW (ref >90)
GLUCOSE: 128 mg/dL — AB (ref 70–99)
POTASSIUM: 4.1 meq/L (ref 3.7–5.3)
Sodium: 134 mEq/L — ABNORMAL LOW (ref 137–147)
TOTAL PROTEIN: 8 g/dL (ref 6.0–8.3)
Total Bilirubin: 0.6 mg/dL (ref 0.3–1.2)

## 2014-01-19 LAB — URINALYSIS, ROUTINE W REFLEX MICROSCOPIC
GLUCOSE, UA: NEGATIVE mg/dL
Ketones, ur: NEGATIVE mg/dL
Nitrite: NEGATIVE
PH: 5 (ref 5.0–8.0)
Protein, ur: 30 mg/dL — AB
Specific Gravity, Urine: 1.02 (ref 1.005–1.030)
Urobilinogen, UA: 1 mg/dL (ref 0.0–1.0)

## 2014-01-19 LAB — IRON AND TIBC
Iron: 24 ug/dL — ABNORMAL LOW (ref 42–135)
Saturation Ratios: 16 % — ABNORMAL LOW (ref 20–55)
TIBC: 150 ug/dL — AB (ref 215–435)
UIBC: 126 ug/dL (ref 125–400)

## 2014-01-19 LAB — VITAMIN B12: VITAMIN B 12: 1239 pg/mL — AB (ref 211–911)

## 2014-01-19 LAB — POC OCCULT BLOOD, ED: FECAL OCCULT BLD: NEGATIVE

## 2014-01-19 LAB — CLOSTRIDIUM DIFFICILE BY PCR: Toxigenic C. Difficile by PCR: NEGATIVE

## 2014-01-19 LAB — FERRITIN: Ferritin: 1787 ng/mL — ABNORMAL HIGH (ref 22–322)

## 2014-01-19 LAB — I-STAT TROPONIN, ED: Troponin i, poc: 0.02 ng/mL (ref 0.00–0.08)

## 2014-01-19 LAB — MAGNESIUM: Magnesium: 2.6 mg/dL — ABNORMAL HIGH (ref 1.5–2.5)

## 2014-01-19 LAB — TSH: TSH: 0.882 u[IU]/mL (ref 0.350–4.500)

## 2014-01-19 LAB — RPR

## 2014-01-19 LAB — POCT HEMOGLOBIN-HEMACUE: HEMOGLOBIN: 11.2 g/dL — AB (ref 13.0–17.0)

## 2014-01-19 MED ORDER — SIMVASTATIN 10 MG PO TABS
10.0000 mg | ORAL_TABLET | Freq: Every day | ORAL | Status: DC
  Filled 2014-01-19: qty 1

## 2014-01-19 MED ORDER — SODIUM CHLORIDE 0.9 % IV SOLN
INTRAVENOUS | Status: AC
  Administered 2014-01-19: 16:00:00 via INTRAVENOUS

## 2014-01-19 MED ORDER — ONDANSETRON HCL 4 MG PO TABS
4.0000 mg | ORAL_TABLET | Freq: Four times a day (QID) | ORAL | Status: DC | PRN
Start: 2014-01-19 — End: 2014-01-20

## 2014-01-19 MED ORDER — SODIUM CHLORIDE 0.9 % IV SOLN
INTRAVENOUS | Status: DC
  Administered 2014-01-19 (×2): via INTRAVENOUS

## 2014-01-19 MED ORDER — DEXTROSE 5 % IV SOLN
1.0000 g | INTRAVENOUS | Status: DC
  Administered 2014-01-20: 1 g via INTRAVENOUS
  Filled 2014-01-19 (×2): qty 10

## 2014-01-19 MED ORDER — SODIUM CHLORIDE 0.9 % IJ SOLN
3.0000 mL | Freq: Two times a day (BID) | INTRAMUSCULAR | Status: DC
  Administered 2014-01-19 – 2014-01-20 (×2): 3 mL via INTRAVENOUS

## 2014-01-19 MED ORDER — MYCOPHENOLATE MOFETIL 250 MG PO CAPS
500.0000 mg | ORAL_CAPSULE | Freq: Two times a day (BID) | ORAL | Status: DC
  Administered 2014-01-19: 500 mg via ORAL
  Filled 2014-01-19 (×4): qty 2

## 2014-01-19 MED ORDER — AMOXICILLIN-POT CLAVULANATE 875-125 MG PO TABS
1.0000 | ORAL_TABLET | Freq: Two times a day (BID) | ORAL | Status: DC
  Administered 2014-01-19: 1 via ORAL
  Filled 2014-01-19 (×3): qty 1

## 2014-01-19 MED ORDER — ADULT MULTIVITAMIN W/MINERALS CH
1.0000 | ORAL_TABLET | Freq: Every day | ORAL | Status: DC
  Administered 2014-01-19 – 2014-01-20 (×2): 1 via ORAL
  Filled 2014-01-19 (×2): qty 1

## 2014-01-19 MED ORDER — HEPARIN SODIUM (PORCINE) 5000 UNIT/ML IJ SOLN
5000.0000 [IU] | Freq: Three times a day (TID) | INTRAMUSCULAR | Status: DC
  Administered 2014-01-20 (×2): 5000 [IU] via SUBCUTANEOUS
  Filled 2014-01-19 (×5): qty 1

## 2014-01-19 MED ORDER — FESOTERODINE FUMARATE ER 4 MG PO TB24
4.0000 mg | ORAL_TABLET | Freq: Every day | ORAL | Status: DC
  Filled 2014-01-19: qty 1

## 2014-01-19 MED ORDER — ACETAMINOPHEN 325 MG PO TABS: 650.0000 mg | ORAL_TABLET | Freq: Four times a day (QID) | ORAL | Status: DC | PRN

## 2014-01-19 MED ORDER — ONDANSETRON HCL 4 MG/2ML IJ SOLN: 4.0000 mg | Freq: Four times a day (QID) | INTRAMUSCULAR | Status: DC | PRN

## 2014-01-19 MED ORDER — MYCOPHENOLATE MOFETIL 500 MG PO TABS: 500.0000 mg | ORAL_TABLET | Freq: Two times a day (BID) | ORAL | Status: DC

## 2014-01-19 MED ORDER — SODIUM CHLORIDE 0.9 % IV BOLUS (SEPSIS)
1000.0000 mL | Freq: Once | INTRAVENOUS | Status: AC
  Administered 2014-01-19: 1000 mL via INTRAVENOUS

## 2014-01-19 MED ORDER — SIROLIMUS 1 MG PO TABS
2.0000 mg | ORAL_TABLET | Freq: Every day | ORAL | Status: DC
  Administered 2014-01-20: 2 mg via ORAL
  Filled 2014-01-19: qty 2

## 2014-01-19 MED ORDER — ACETAMINOPHEN 650 MG RE SUPP: 650.0000 mg | Freq: Four times a day (QID) | RECTAL | Status: DC | PRN

## 2014-01-19 MED ORDER — LEVOFLOXACIN 750 MG PO TABS
750.0000 mg | ORAL_TABLET | Freq: Once | ORAL | Status: AC
  Administered 2014-01-19: 750 mg via ORAL
  Filled 2014-01-19: qty 1

## 2014-01-19 MED ORDER — SIROLIMUS 1 MG PO TABS: 2.0000 mg | ORAL_TABLET | Freq: Every day | ORAL | Status: DC

## 2014-01-19 MED ORDER — DOCUSATE SODIUM 100 MG PO CAPS
100.0000 mg | ORAL_CAPSULE | Freq: Every day | ORAL | Status: DC
  Filled 2014-01-19: qty 1

## 2014-01-19 MED ORDER — FAMOTIDINE 10 MG PO TABS
10.0000 mg | ORAL_TABLET | Freq: Two times a day (BID) | ORAL | Status: DC
  Administered 2014-01-19: 10 mg via ORAL
  Filled 2014-01-19 (×3): qty 1

## 2014-01-19 NOTE — ED Notes (Signed)
Hemoccult collected [facility] in [facility] lab.

## 2014-01-19 NOTE — ED Notes (Signed)
Patient transported to X-ray 

## 2014-01-19 NOTE — ED Notes (Signed)
Returned from xray

## 2014-01-19 NOTE — ED Notes (Signed)
Social work er at bedside talking with pt. Pt also given a meal tray to eat

## 2014-01-19 NOTE — ED Notes (Signed)
Meal tray ordered 

## 2014-01-19 NOTE — ED Provider Notes (Addendum)
CSN: [id number]     Arrival date & time 01/19/14  1324 History   First MD Initiated Contact with Patient 01/19/14 1340     Chief Complaint  Patient presents with  . Weakness     (Consider location/radiation/quality/duration/timing/severity/associated sxs/prior Treatment) HPI Comments: Patient history of CHF, coronary artery disease, and hypertension, status post heart transplant at [facility] in 1996, presents with generalized weakness. He was brought in yesterday after he was at short stay getting a shot of Procrit. He was also noted to have a potassium of 2.4 at that time. He was acting chloride and was sent over here to the ED for evaluation. At that point he refused admission and seemed to be mentally capable, and was discharged home AGAINST MEDICAL ADVICE. He comes back in today because a neighbor found him sitting on the porch. He looks again worrisome by the bystander and he called EMS. Patient says he feels generally weak. He's had a little bit of a cough. He is currently denying other complaints. He denies any chest pain or shortness of breath. He states he is taking his medications, including his rejection medications. He denies any fevers or vomiting. He does wear a diaper at home for unclear reasons and he is noted to have some skin breakdown on his bottom.  Patient is a 73 y.o. male presenting with weakness.  Weakness Pertinent negatives include no chest pain, no abdominal pain, no headaches and no shortness of breath.    Past Medical History  Diagnosis Date  . Renal disorder   . Coronary artery disease   . CHF (congestive heart failure)   . Hypertension    Past Surgical History  Procedure Laterality Date  . Heart transplant     History reviewed. No pertinent family history. History  Substance Use Topics  . Smoking status: Current Every Day Smoker  . Smokeless tobacco: Not on file  . Alcohol Use: No    Review of Systems  Constitutional: Negative for fever, chills,  diaphoresis and fatigue.  HENT: Negative for congestion, rhinorrhea and sneezing.   Eyes: Negative.   Respiratory: Positive for cough. Negative for chest tightness and shortness of breath.   Cardiovascular: Negative for chest pain and leg swelling.  Gastrointestinal: Negative for nausea, vomiting, abdominal pain, diarrhea and blood in stool.  Genitourinary: Negative for frequency, hematuria, flank pain and difficulty urinating.  Musculoskeletal: Negative for arthralgias and back pain.  Skin: Negative for rash.  Neurological: Positive for weakness. Negative for dizziness, speech difficulty, numbness and headaches.      Allergies  Review of patient's allergies indicates no known allergies.  Home Medications   Prior to Admission medications   Medication Sig Start Date End Date Taking? Authorizing Provider  carvedilol (COREG) 12.5 MG tablet Take 12.5 mg by mouth 2 (two) times daily with a meal.    Historical Provider, MD  docusate sodium (COLACE) 100 MG capsule Take 100 mg by mouth daily.    Historical Provider, MD  Multiple Vitamin (MULITIVITAMIN WITH MINERALS) TABS Take 1 tablet by mouth daily.    Historical Provider, MD  mycophenolate (CELLCEPT) 500 MG tablet Take 500 mg by mouth 2 (two) times daily.    Historical Provider, MD  pravastatin (PRAVACHOL) 20 MG tablet Take 20 mg by mouth daily.    Historical Provider, MD  ranitidine (ZANTAC) 150 MG tablet Take 150 mg by mouth 2 (two) times daily.    Historical Provider, MD  sirolimus (RAPAMUNE) 2 MG tablet Take 2 mg by mouth  daily.    Historical Provider, MD  tolterodine (DETROL LA) 2 MG 24 hr capsule Take 2 mg by mouth daily.    Historical Provider, MD   BP 102/54  Pulse 79  Temp(Src) 97.5 F (36.4 C) (Oral)  Resp 19  SpO2 94% Physical Exam  Constitutional: He is oriented to person, place, and time. He appears well-developed.  cachectic  HENT:  Head: Normocephalic and atraumatic.  Mouth/Throat: Oropharynx is clear and moist.   Eyes: Pupils are equal, round, and reactive to light.  Neck: Normal range of motion. Neck supple.  Cardiovascular: Normal rate, regular rhythm and normal heart sounds.   Pulmonary/Chest: Effort normal and breath sounds normal. No respiratory distress. He has no wheezes. He has no rales. He exhibits no tenderness.  rhonchi  Abdominal: Soft. Bowel sounds are normal. There is no tenderness. There is no rebound and no guarding.  Musculoskeletal: Normal range of motion. He exhibits no edema.  Lymphadenopathy:    He has no cervical adenopathy.  Neurological: He is alert and oriented to person, place, and time.  Moves all extremities symmetrically  Skin: Skin is warm and dry. No rash noted.  Psychiatric: He has a normal mood and affect.    ED Course  Procedures (including critical care time) Labs Review Results for orders placed during the hospital encounter of 01/19/14  CBC WITH DIFFERENTIAL      Result Value Ref Range   WBC 15.6 (*) 4.0 - 10.5 K/uL   RBC 4.84  4.22 - 5.81 MIL/uL   Hemoglobin 12.3 (*) 13.0 - 17.0 g/dL   HCT 16.1  09.6 - 04.5 %   MCV 81.6  78.0 - 100.0 fL   MCH 25.4 (*) 26.0 - 34.0 pg   MCHC 31.1  30.0 - 36.0 g/dL   RDW 40.9 (*) 81.1 - 91.4 %   Platelets 204  150 - 400 K/uL   Neutrophils Relative % 85 (*) 43 - 77 %   Neutro Abs 13.3 (*) 1.7 - 7.7 K/uL   Lymphocytes Relative 6 (*) 12 - 46 %   Lymphs Abs 0.9  0.7 - 4.0 K/uL   Monocytes Relative 9  3 - 12 %   Monocytes Absolute 1.4 (*) 0.1 - 1.0 K/uL   Eosinophils Relative 0  0 - 5 %   Eosinophils Absolute 0.0  0.0 - 0.7 K/uL   Basophils Relative 0  0 - 1 %   Basophils Absolute 0.0  0.0 - 0.1 K/uL  COMPREHENSIVE METABOLIC PANEL      Result Value Ref Range   Sodium 134 (*) 137 - 147 mEq/L   Potassium 4.1  3.7 - 5.3 mEq/L   Chloride 86 (*) 96 - 112 mEq/L   CO2 29  19 - 32 mEq/L   Glucose, Bld 128 (*) 70 - 99 mg/dL   BUN 49 (*) 6 - 23 mg/dL   Creatinine, Ser 7.82 (*) 0.50 - 1.35 mg/dL   Calcium 9.4  8.4 - 95.6  mg/dL   Total Protein 8.0  6.0 - 8.3 g/dL   Albumin 2.8 (*) 3.5 - 5.2 g/dL   AST 47 (*) 0 - 37 U/L   ALT 11  0 - 53 U/L   Alkaline Phosphatase 105  39 - 117 U/L   Total Bilirubin 0.6  0.3 - 1.2 mg/dL   GFR calc non Af Amer 19 (*) >90 mL/min   GFR calc Af Amer 22 (*) >90 mL/min  URINALYSIS, ROUTINE W REFLEX MICROSCOPIC  Result Value Ref Range   Color, Urine AMBER (*) YELLOW   APPearance CLOUDY (*) CLEAR   Specific Gravity, Urine 1.020  1.005 - 1.030   pH 5.0  5.0 - 8.0   Glucose, UA NEGATIVE  NEGATIVE mg/dL   Hgb urine dipstick MODERATE (*) NEGATIVE   Bilirubin Urine SMALL (*) NEGATIVE   Ketones, ur NEGATIVE  NEGATIVE mg/dL   Protein, ur 30 (*) NEGATIVE mg/dL   Urobilinogen, UA 1.0  0.0 - 1.0 mg/dL   Nitrite NEGATIVE  NEGATIVE   Leukocytes, UA MODERATE (*) NEGATIVE  URINE MICROSCOPIC-ADD ON      Result Value Ref Range   Squamous Epithelial / LPF FEW (*) RARE   WBC, UA 7-10  <3 WBC/hpf   RBC / HPF 0-2  <3 RBC/hpf   Bacteria, UA FEW (*) RARE   Casts HYALINE CASTS (*) NEGATIVE   Urine-Other AMORPHOUS URATES/PHOSPHATES    I-STAT CHEM 8, ED      Result Value Ref Range   Sodium 131 (*) 137 - 147 mEq/L   Potassium 4.3  3.7 - 5.3 mEq/L   Chloride 93 (*) 96 - 112 mEq/L   BUN 64 (*) 6 - 23 mg/dL   Creatinine, Ser 1.613.30 (*) 0.50 - 1.35 mg/dL   Glucose, Bld 096128 (*) 70 - 99 mg/dL   Calcium, Ion 0.450.99 (*) 1.13 - 1.30 mmol/L   TCO2 33  0 - 100 mmol/L   Hemoglobin 14.3  13.0 - 17.0 g/dL   HCT 40.942.0  81.139.0 - 91.452.0 %  I-STAT TROPOININ, ED      Result Value Ref Range   Troponin i, poc 0.02  0.00 - 0.08 ng/mL   Comment 3            Dg Chest 2 View  01/19/2014   CLINICAL DATA:  Cough and weakness.  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 Cardiac silhouette remains mildly enlarged. Lungs are slightly better inflated than on the prior study. There is mild peribronchial thickening with streaky perihilar opacity bilaterally. No  confluent airspace opacity, overt edema, pleural effusion, or pneumothorax is identified. No acute osseous abnormality is identified.  IMPRESSION: Findings suggestive of bronchitis.   Electronically Signed   By: Sebastian AcheAllen  Grady   On: 01/19/2014 14:31     Imaging Review Dg Chest 2 View  01/19/2014   CLINICAL DATA:  Cough and weakness.  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 Cardiac silhouette remains mildly enlarged. Lungs are slightly better inflated than on the prior study. There is mild peribronchial thickening with streaky perihilar opacity bilaterally. No confluent airspace opacity, overt edema, pleural effusion, or pneumothorax is identified. No acute osseous abnormality is identified.  IMPRESSION: Findings suggestive of bronchitis.   Electronically Signed   By: Sebastian AcheAllen  Grady   On: 01/19/2014 14:31     EKG Interpretation None      Date: 01/19/2014  Rate: 80  Rhythm: normal sinus rhythm  QRS Axis: left  Intervals: normal  ST/T Wave abnormalities: ST depressions anteriorly  Conduction Disutrbances:none  Narrative Interpretation:   Old EKG Reviewed: none available, changed   Date: 01/19/2014 posterior EKG  Rate: 85  Rhythm: normal sinus rhythm  QRS Axis: normal  Intervals: normal  ST/T Wave abnormalities: nonspecific ST changes  Conduction Disutrbances:none  Narrative Interpretation:   Old EKG Reviewed: this is posterior EKG  MDM   Final diagnoses:  Weakness  Renal failure    Patient appears to be quite cachectic and does not appear to be taking good care of himself at home. He has a marked increase in his creatinine from one month ago. Given this I feel he should be admitted for IV fluids and renal consultation. He was given a dose of Levaquin for his probable bronchitis and possible UTI. I will consult unassigned medicine for admission.  He does have some ST depression V1-V3, changed from prior EKG in 2009.  I  did a posterior EKG which was negaive for ST elevation.  Pt again denies CP or SOB.    Rolan BuccoMelanie Kriss Ishler, MD 01/19/14 1546  Rolan BuccoMelanie Sabin Gibeault, MD 01/19/14 [id number]  Rolan BuccoMelanie Breeann Reposa, MD 01/19/14 [phone]

## 2014-01-19 NOTE — ED Notes (Signed)
Ordered Reg Diet Tray 

## 2014-01-19 NOTE — ED Notes (Signed)
Pt here from home with c/o weakness , pt was here yesterday with low b/p , pt back today with c/o weakness

## 2014-01-19 NOTE — H&P (Signed)
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 He also appeared quite weak however He signed out AGAINST MEDICAL ADVICE. This morning he was sitting on the porch and a neighbor called EMS as he appeared quite weak. Patient reports that for possible days he has been feeling weak and has some cough. He has a flattened affect and denies any other symptoms. He is not sure why he has been brought to the hospital. Patient reports that he lives independently and has been taking his medications regularly. He reports that he may have lost some weight but is unable to describe it in detail. He denies any fall. He denies any hematemesis or dark stool. Patient is a poor historian and does not participate in conversation. Patient denies headache, dizziness, fever, chills, nausea , vomiting, chest pain, palpitations, SOB, abdominal pain, bowel or urinary symptoms. Denies change in appetite.  Course in the ED Patient on to have blood pressure 102/54 mmHg. Blood work showed leukocytosis with WBC of 15.6, and hemoglobin of 12.3 platelets were normal. Chemistry showed sodium of 131, potassium of 4.3, chloride of 93, BUN of 64 and creatinine of 2.3, glucose of 128. Chest x-ray was suggestive of bronchitis. UA was suggestive of UTI.     Review of Systems:  Constitutional: Denies fever, chills, diaphoresis, appetite change .fatigue+.  HEENT: Denies photophobia, eye pain,  hearing loss, ear pain, congestion, sore throat, rhinorrhea,  sneezing, mouth sores, trouble swallowing, neck pain,   Respiratory: Denies SOB, DOE, cough, chest tightness,  and wheezing.   Cardiovascular: Denies chest pain, palpitations and leg swelling.  Gastrointestinal: Denies nausea, vomiting, abdominal pain, diarrhea, constipation, blood in stool and abdominal distention.  Genitourinary: Denies dysuria, urgency, frequency, hematuria, flank pain and difficulty urinating.  Endocrine: Denies hot or cold intolerance,  polyuria, polydipsia. Musculoskeletal: Denies myalgias, back pain, joint swelling, arthralgias and gait problem.  Skin: Denies pallor, rash and wound.  Neurological: Generalized weakness, Denies dizziness, seizures, syncope,  light-headedness, numbness and headaches.  Hematological: Denies adenopathy.  Psychiatric/Behavioral: Deniesconfusion, nervousness, sleep disturbance and agitation   Past Medical History  Diagnosis Date  . Renal disorder   . Coronary artery disease   . CHF (congestive heart failure)   . Hypertension    Past Surgical History  Procedure Laterality Date  . Heart transplant     Social History:  reports that he has been smoking one pack per day for several years.  He does not have any smokeless tobacco history on file. He reports that he does not drink alcohol or use illicit drugs.  No Known Allergies  History reviewed. No pertinent family history.  Prior to Admission medications   Medication Sig Start Date End Date Taking? Authorizing Provider  carvedilol (COREG) 12.5 MG tablet Take 12.5 mg by mouth 2 (two) times daily with a meal.    Historical Provider, MD  docusate sodium (COLACE) 100 MG capsule Take 100 mg by mouth daily.    Historical Provider, MD  Multiple Vitamin (MULITIVITAMIN WITH MINERALS) TABS Take  1 tablet by mouth daily.    Historical Provider, MD  mycophenolate (CELLCEPT) 500 MG tablet Take 500 mg by mouth 2 (two) times daily.    Historical Provider, MD  pravastatin (PRAVACHOL) 20 MG tablet Take  20 mg by mouth daily.    Historical Provider, MD  ranitidine (ZANTAC) 150 MG tablet Take 150 mg by mouth 2 (two) times daily.    Historical Provider, MD  sirolimus (RAPAMUNE) 2 MG tablet Take 2 mg by mouth daily.    Historical Provider, MD  tolterodine (DETROL LA) 2 MG 24 hr capsule Take 2 mg by mouth daily.    Historical Provider, MD     Physical Exam:  Filed Vitals:   01/19/14 1329 01/19/14 1345 01/19/14 1400  BP: 93/65 106/67 102/54  Pulse: 87  79  Temp: 97.5 F (36.4 C)    TempSrc: Oral    Resp: 18 19 19   SpO2: 97%  94%    Constitutional: Vital signs reviewed.  Patient is an elderly cachectic male in no acute distress HEENT: no pallor, no icterus, moist oral mucosa, no cervical lymphadenopathy Cardiovascular: RRR, S1 normal, S2 normal, no MRG Chest: Diminished breath sounds bilaterally, no crackles, rhonchi or wheeze Abdominal: Soft. Non-tender, non-distended, bowel sounds are normal, no masses, organomegaly, or guarding present.  GU: no CVA tenderness Ext: warm, no edema Neurological: A&O x3, non focal  Labs on Admission:  Basic Metabolic Panel:  Recent Labs Lab 01/18/14 1302 01/19/14 1340 01/19/14 1404  NA 133* 134* 131*  K 2.5* 4.1 4.3  CL 85* 86* 93*  CO2 29 29  --   GLUCOSE 192* 128* 128*  BUN 47* 49* 64*  CREATININE 3.05* 3.07* 3.30*  CALCIUM 9.3 9.4  --   PHOS 2.3  --   --    Liver Function Tests:  Recent Labs Lab 01/18/14 1302 01/19/14 1340  AST  --  47*  ALT  --  11  ALKPHOS  --  105  BILITOT  --  0.6  PROT  --  8.0  ALBUMIN 2.8* 2.8*   No results found for this basename: LIPASE, AMYLASE,  in the last 168 hours No results found for this basename: AMMONIA,  in the last 168 hours CBC:  Recent Labs Lab 01/18/14 1251 01/19/14 1340 01/19/14 1404  WBC  --  15.6*  --   NEUTROABS  --  13.3*  --   HGB 11.2* 12.3* 14.3  HCT  --  39.5 42.0  MCV  --  81.6  --   PLT  --  204  --    Cardiac Enzymes: No results found for this basename:  CKTOTAL, CKMB, CKMBINDEX, TROPONINI,  in the last 168 hours BNP: No components found with this basename: POCBNP,  CBG: No results found for this basename: GLUCAP,  in the last 168 hours  Radiological Exams on Admission: Dg Chest 2 View  01/19/2014   CLINICAL DATA:  Cough and weakness.  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 Cardiac silhouette remains mildly enlarged. Lungs are slightly better inflated than on the prior study. There is mild peribronchial thickening with streaky perihilar opacity bilaterally. No confluent airspace opacity, overt edema, pleural effusion, or pneumothorax is identified. No acute osseous abnormality is identified.  IMPRESSION: Findings suggestive of bronchitis.   Electronically Signed   By: Sebastian Ache   On: 01/19/2014 14:31    EKG: Normal sinus rhythm, prolonged QTC of 543  Assessment/Plan  Principal Problem:   Acute on chronic kidney disease, stage 3 Possibly secondary to dehydration and UTI. Admit to telemetry. Will hydrate with IV normal saline at 100 cc per hour. Check urine  lites.  Avoid nephrotoxins. Monitor renal function with hydration in the morning. If not improved will need to modify his nephrologist.   Active Problems:  weakness and dehydration Monitor with IV fluids. Check orthostatic vitals. Check B12, TSH, RPR and HIV. PT eval    Hyponatremia  likely secondary to dehydration. Monitor with IV fluids    Bronchitis  will place on empiric  Rocephin and Augmentin. Avoid quinolones or macrolides given prolonged QTC    CAD (coronary artery disease) History of MI and cardiac transplant secondary to prolonged cardiogenic shock. Continue cellcept and serolimus. [facility]     Hypotension Check orthostasis. Hold carvedilol    UTI (urinary tract infection) On empiric rocephin. Check cx    Iron deficiency anemia Gets procrit shots as outpt.   Severe protein  calorie malnutrition Nutrition consult.    Prolonged QTC Monitor and telemetry. Recheck EKG in the morning. Check magnesium level.   Diet:cardiac  DVT prophylaxis: sq  heparin    Code Status: full code Family Communication:None at bedside Disposition Plan:currently  in patient   Jordis Repetto [facility] Pager [phone]  Total time spent on admission :70 minutes  If 7PM-7AM, please contact night-coverage [email or web address] Password [email or web address] 01/19/2014, 4:40 PM

## 2014-01-20 LAB — BASIC METABOLIC PANEL
BUN: 44 mg/dL — ABNORMAL HIGH (ref 6–23)
CHLORIDE: 93 meq/L — AB (ref 96–112)
CO2: 25 mEq/L (ref 19–32)
CREATININE: 2.74 mg/dL — AB (ref 0.50–1.35)
Calcium: 8.8 mg/dL (ref 8.4–10.5)
GFR calc non Af Amer: 21 mL/min — ABNORMAL LOW (ref >90)
GFR, EST AFRICAN AMERICAN: 25 mL/min — AB (ref >90)
GLUCOSE: 104 mg/dL — AB (ref 70–99)
POTASSIUM: 2.9 meq/L — AB (ref 3.7–5.3)
Sodium: 137 mEq/L (ref 137–147)

## 2014-01-20 LAB — CBC
HEMATOCRIT: 35.7 % — AB (ref 39.0–52.0)
Hemoglobin: 10.9 g/dL — ABNORMAL LOW (ref 13.0–17.0)
MCH: 24.9 pg — AB (ref 26.0–34.0)
MCHC: 30.5 g/dL (ref 30.0–36.0)
MCV: 81.7 fL (ref 78.0–100.0)
Platelets: 202 10*3/uL (ref 150–400)
RBC: 4.37 MIL/uL (ref 4.22–5.81)
RDW: 18 % — ABNORMAL HIGH (ref 11.5–15.5)
WBC: 12.7 10*3/uL — ABNORMAL HIGH (ref 4.0–10.5)

## 2014-01-20 LAB — HEMOGLOBIN A1C
HEMOGLOBIN A1C: 6.1 % — AB (ref <5.7)
Mean Plasma Glucose: 128 mg/dL — ABNORMAL HIGH (ref <117)

## 2014-01-20 LAB — HIV ANTIBODY (ROUTINE TESTING W REFLEX): HIV: NONREACTIVE

## 2014-01-20 MED ORDER — AMOXICILLIN-POT CLAVULANATE 500-125 MG PO TABS
1.0000 | ORAL_TABLET | Freq: Two times a day (BID) | ORAL | Status: DC
  Filled 2014-01-20 (×2): qty 1

## 2014-01-20 MED ORDER — ENSURE COMPLETE PO LIQD: 237.0000 mL | Freq: Two times a day (BID) | ORAL | Status: DC

## 2014-01-20 NOTE — Progress Notes (Signed)
INITIAL NUTRITION ASSESSMENT  DOCUMENTATION CODES Per approved criteria  -Severe malnutrition in the context of chronic illness -Underweight   INTERVENTION: Add Ensure Complete po BID, each supplement provides 350 kcal and 13 grams of protein. Continue MVI daily. RD to continue to follow nutrition care plan.  NUTRITION DIAGNOSIS: Inadequate oral intake related to poor appetite as evidenced by severe fat and muscle mass loss.   Goal: Intake to meet >90% of estimated nutrition needs.  Monitor:  weight trends, lab trends, I/O's, PO intake, supplement tolerance  Reason for Assessment: MD Consult for Assessment  72 y.o. male18  Admitting Dx: Acute on chronic kidney disease, stage 3  ASSESSMENT: PMHx significant for CAD, heart tx, CKD III, anemia. Admitted with weakness. Work-up reveals dehydration and UTI.  Currently ordered for Heart Healthy. Eating variably - consumed 25% of breakfast this morning per student nurse. Pt very confused when speaking with this RD. He reports that his being in the hospital is a "set-up" and he is unsure of why he is here. Pt agitated when I asked him about his weight. He is unable to tell me his usual body weight.  Nutrition Focused Physical Exam:  Subcutaneous Fat:  Orbital Region: severe depletion Upper Arm Region: severe depletion Thoracic and Lumbar Region: severe depletion  Muscle:  Temple Region: severe depletion Clavicle Bone Region: severe depletion Clavicle and Acromion Bone Region: severe depletion Scapular Bone Region: severe depletion Dorsal Hand: severe depletion Patellar Region: severe depletion Anterior Thigh Region: severe depletion Posterior Calf Region: severe depletion  Edema: none  Pt meets criteria for severe malnutrition MALNUTRITION in the context of chronic illness as evidenced by severe fat and muscle mass loss.  Potassium low at 2.9 Phosphorus WNL Magnesium elevated at 2.6  Height: Ht Readings from Last 1  Encounters:  01/19/14 5\' 9"  (1.753 m)    Weight: Wt Readings from Last 1 Encounters:  01/19/14 107 lb (48.535 kg)    Ideal Body Weight: 160 lb  % Ideal Body Weight: 67%  Wt Readings from Last 10 Encounters:  01/19/14 107 lb (48.535 kg)    Usual Body Weight: n/a  % Usual Body Weight: n/a  BMI:  Body mass index is 15.79 kg/(m^2). Underweight  Estimated Nutritional Needs: Kcal: 1500 - 1700 Protein: 50 - 60 grams Fluid: 1.5 liters daily  Skin:  Stage II L sacrum  Diet Order: Cardiac  EDUCATION NEEDS: -Education not appropriate at this time   Intake/Output Summary (Last 24 hours) at 01/20/14 1112 Last data filed at 01/20/14 1006  Gross per 24 hour  Intake 586.67 ml  Output      0 ml  Net 586.67 ml    Last BM: 4/29   Labs:   Recent Labs Lab 01/18/14 1302 01/19/14 1340 01/19/14 1404 01/19/14 1738 01/20/14 0812  NA 133* 134* 131*  --  137  K 2.5* 4.1 4.3  --  2.9*  CL 85* 86* 93*  --  93*  CO2 29 29  --   --  25  BUN 47* 49* 64*  --  44*  CREATININE 3.05* 3.07* 3.30*  --  2.74*  CALCIUM 9.3 9.4  --   --  8.8  MG  --   --   --  2.6*  --   PHOS 2.3  --   --   --   --   GLUCOSE 192* 128* 128*  --  104*    CBG (last 3)  No results found for this basename: GLUCAP,  in the last 72 hours  Scheduled Meds: . amoxicillin-clavulanate  1 tablet Oral BID  . docusate sodium  100 mg Oral Daily  . famotidine  10 mg Oral BID  . fesoterodine  4 mg Oral Daily  . heparin  5,000 Units Subcutaneous 3 times per day  . multivitamin with minerals  1 tablet Oral Daily  . mycophenolate  500 mg Oral BID  . simvastatin  10 mg Oral q1800  . sirolimus  2 mg Oral Daily  . sodium chloride  3 mL Intravenous Q12H    Continuous Infusions: . sodium chloride 100 mL/hr at 01/19/14 2300    Past Medical History  Diagnosis Date  . Renal disorder   . Coronary artery disease   . CHF (congestive heart failure)   . Hypertension     Past Surgical History  Procedure  Laterality Date  . Heart transplant      George MottoSamantha Lexington Devine MS, RD, LDN Inpatient Registered Dietitian Pager: [phone] After-hours pager: [phone]

## 2014-01-20 NOTE — Progress Notes (Signed)
Called patients wife [name] [phone]   Waiting for someone to pick up patient at this time no way she  Out of town

## 2014-01-20 NOTE — Progress Notes (Signed)
Patient refusing to take meds and  Says he wants to get out of here. Dr. Bonnee QuinBurier informed and  He talked with patient. Alert and oriented  He would allow him to leave, Wanted the papers for signing Leaving Hospital Against Medical Advice

## 2014-01-20 NOTE — Progress Notes (Addendum)
[facility] PROGRESS NOTE  Francie MassingJulius C Montroy RUE:[id number] DOB: 01/30/1941 DOA: 01/19/2014 PCP: Dagoberto LigasPATEL,JAY K., MD  I have extensively d/w patient about his medical problems and the need for treatment; he is alert, oriented, he is able to make decisions;  -he understands his medical condition, all complications related to his medical illness and he wants to go home, AMA; he does not want to stay in the hospital   Esperanza SheetsUlugbek N Donivan Thammavong  [facility] Pager [phone]. If 7PM-7AM, please contact night-coverage at [email or web address], password [email or web address] 01/20/2014, 11:18 AM  LOS: 1 day

## 2014-01-21 ENCOUNTER — Emergency Department (HOSPITAL_COMMUNITY)
Admission: EM | Admit: 2014-01-21 | Discharge: 2014-01-21 | Disposition: A | Discharge status: Home / Self Care | Payer: Medicare Other | Attending: Emergency Medicine | Admitting: Emergency Medicine

## 2014-01-21 ENCOUNTER — Encounter (HOSPITAL_COMMUNITY): Payer: Self-pay | Admitting: Emergency Medicine

## 2014-01-21 DIAGNOSIS — Z941 Heart transplant status: Secondary | ICD-10-CM | POA: Insufficient documentation

## 2014-01-21 DIAGNOSIS — Z87448 Personal history of other diseases of urinary system: Secondary | ICD-10-CM | POA: Diagnosis not present

## 2014-01-21 DIAGNOSIS — Z79899 Other long term (current) drug therapy: Secondary | ICD-10-CM | POA: Diagnosis not present

## 2014-01-21 DIAGNOSIS — K0820 Unspecified atrophy of edentulous alveolar ridge: Secondary | ICD-10-CM | POA: Diagnosis not present

## 2014-01-21 DIAGNOSIS — K5641 Fecal impaction: Secondary | ICD-10-CM

## 2014-01-21 DIAGNOSIS — K6289 Other specified diseases of anus and rectum: Secondary | ICD-10-CM | POA: Diagnosis present

## 2014-01-21 DIAGNOSIS — F17200 Nicotine dependence, unspecified, uncomplicated: Secondary | ICD-10-CM | POA: Insufficient documentation

## 2014-01-21 DIAGNOSIS — L89309 Pressure ulcer of unspecified buttock, unspecified stage: Secondary | ICD-10-CM | POA: Diagnosis not present

## 2014-01-21 DIAGNOSIS — I2510 Atherosclerotic heart disease of native coronary artery without angina pectoris: Secondary | ICD-10-CM | POA: Insufficient documentation

## 2014-01-21 DIAGNOSIS — I10 Essential (primary) hypertension: Secondary | ICD-10-CM | POA: Insufficient documentation

## 2014-01-21 LAB — URINE CULTURE: Colony Count: >=100000

## 2014-01-21 MED ORDER — MINERAL OIL RE ENEM
1.0000 | ENEMA | Freq: Once | RECTAL | Status: AC
  Administered 2014-01-21: 1 via RECTAL
  Filled 2014-01-21: qty 1

## 2014-01-21 MED ORDER — LIDOCAINE HCL 2 % EX GEL
Freq: Once | CUTANEOUS | Status: AC
  Administered 2014-01-21: 20
  Filled 2014-01-21: qty 20

## 2014-01-21 NOTE — ED Notes (Signed)
Pt leaving AMA, refuses to sign AMA form.  Pt able to tell us his address and st's he is getting home by cab.  Pt to ED lobby via wheelchair.  Dr. Radford PaxBeaton aware of same.

## 2014-01-21 NOTE — ED Notes (Signed)
Patient arrived via [facility] from home with rectal pain. 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 EMS reports patient has a raspy cough with non-production with diminished on left lung field. VSS. A/O.

## 2014-01-21 NOTE — ED Provider Notes (Signed)
CSN: [id number]     Arrival date & time 01/21/14  1219 History   First MD Initiated Contact with Patient 01/21/14 1330     Chief Complaint  Patient presents with  . Rectal Pain     (Consider location/radiation/quality/duration/timing/severity/associated sxs/prior Treatment) HPI Comments: Pt reports rectal pain for about 3-4 days.  He doesn't recall if he has had problems with constipation or not.  Currently, no fevers, chills, no abd pain, no N/V.  Pt thinks something happened similar a few years ago but cannot tell me much about it.  No blood in his stool that he knows of.  No CP, SOB, dizziness.  Pain is constant, but waxes and wanes.    The history is provided by the patient.    Past Medical History  Diagnosis Date  . Renal disorder   . Coronary artery disease   . CHF (congestive heart failure)   . Hypertension    Past Surgical History  Procedure Laterality Date  . Heart transplant     History reviewed. No pertinent family history. History  Substance Use Topics  . Smoking status: Current Every Day Smoker  . Smokeless tobacco: Not on file  . Alcohol Use: No    Review of Systems  Constitutional: Negative for fever and chills.  Respiratory: Negative for shortness of breath.   Cardiovascular: Negative for chest pain.  Gastrointestinal: Positive for rectal pain. Negative for nausea, vomiting, abdominal pain and anal bleeding.  Neurological: Negative for dizziness and headaches.      Allergies  Review of patient's allergies indicates no known allergies.  Home Medications   Prior to Admission medications   Medication Sig Start Date End Date Taking? Authorizing Provider  carvedilol (COREG) 12.5 MG tablet Take 12.5 mg by mouth 2 (two) times daily with a meal.   Yes Historical Provider, MD  docusate sodium (COLACE) 100 MG capsule Take 100 mg by mouth daily.   Yes Historical Provider, MD  Multiple Vitamin (MULITIVITAMIN WITH MINERALS) TABS Take 1 tablet by mouth daily.    Yes Historical Provider, MD  mycophenolate (CELLCEPT) 500 MG tablet Take 500 mg by mouth 2 (two) times daily.   Yes Historical Provider, MD  pravastatin (PRAVACHOL) 20 MG tablet Take 20 mg by mouth daily.   Yes Historical Provider, MD  ranitidine (ZANTAC) 150 MG tablet Take 150 mg by mouth 2 (two) times daily.   Yes Historical Provider, MD  sirolimus (RAPAMUNE) 2 MG tablet Take 2 mg by mouth daily.   Yes Historical Provider, MD  tolterodine (DETROL LA) 2 MG 24 hr capsule Take 2 mg by mouth daily.   Yes Historical Provider, MD   BP 124/98  Pulse 87  Temp(Src) 97.8 F (36.6 C) (Oral)  Ht 5' 8.5" (1.74 m)  Wt 107 lb (48.535 kg)  BMI 16.03 kg/m2  SpO2 100% Physical Exam  Nursing note and vitals reviewed. Constitutional: He appears well-developed and well-nourished.  HENT:  Head: Normocephalic and atraumatic.  Mouth/Throat: Uvula is midline. Mucous membranes are not dry.  edentulous  Eyes: Conjunctivae are normal. No scleral icterus.  Cardiovascular: Normal rate and intact distal pulses.   Pulmonary/Chest: Effort normal. He has no wheezes. He has no rales.  Abdominal: Normal appearance. There is no tenderness. There is no rebound and no guarding.  Genitourinary: Rectal exam shows tenderness.  Chaperone present.  Liquid brownish/yellowish stool leaking from rectum.  Firm stool impaction noted on DRE.  Pt did not tolerate disimpaction.  Small skin decubitus noted, early stage.  Neurological: He is alert. Coordination normal.  Skin: Skin is warm and dry. No rash noted. He is not diaphoretic.    ED Course  Procedures (including critical care time) Labs Review Labs Reviewed - No data to display  Imaging Review Dg Chest 2 View  01/19/2014   CLINICAL DATA:  Cough and weakness.  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 Cardiac silhouette remains mildly enlarged. Lungs are slightly better inflated than on the prior study. There is  mild peribronchial thickening with streaky perihilar opacity bilaterally. No confluent airspace opacity, overt edema, pleural effusion, or pneumothorax is identified. No acute osseous abnormality is identified.  IMPRESSION: Findings suggestive of bronchitis.   Electronically Signed   By: Sebastian AcheAllen  Grady   On: 01/19/2014 14:31     EKG Interpretation None     RA sat is 100% and I interpret to be normal   3:58 PM RN's have given enema, awaiting pt to try to have a BM.  Abd remains soft, vital signs are WNL.  Filed Vitals:   01/21/14 1500  BP: 103/82  Pulse: 81  Temp:    Discussed with Dr. Radford PaxBeaton to follow up on patient.  MDM   Final diagnoses:  Fecal impaction in rectum  Decubitus ulcer of buttock    Pt likely rectal pain related to constipation and rectal impaction.  Pt did not tolerate manual disimpaction.  Will apply lidocaine jelly to rectum, then try mineral oil enema.  Abd is soft, no fever.      Gavin PoundMichael Y. Osborn Pullin, MD 01/21/14 1558

## 2014-01-21 NOTE — Progress Notes (Signed)
  CARE MANAGEMENT ED NOTE 01/21/2014  Patient:  [name],[name]   Account Number:  [id number]  Date Initiated:  01/21/2014  Documentation initiated by:  Ferdinand CavaSCHETTINO,Hovanes Hymas  Subjective/Objective Assessment:   73 yo [facility] with rectal pain post 1 day discharge     Subjective/Objective Assessment Detail:   Pt reports rectal pain for about 3-4 days.  He doesn't recall if he has had problems with constipation or not. Currently, no fevers, chills, no abd pain, no N/V.  Pt thinks something happened similar a few years ago but cannot tell me much about it.  No blood in his stool that he knows of.  No CP, SOB, dizziness.  Pain is constant, but waxes and wanes.     Action/Plan:   Action/Plan Detail:   Anticipated DC Date:       Status Recommendation to Physician:   Result of Recommendation:        Choice offered to / List presented to:            Status of service:    ED Comments:   ED Comments Detail:  CM visited patient regarding ED visit. Patient confirmed that he did leave the hospital AMA the previous day because he does not want to be in the hospital. He stated that he called EMS due to the severe rectal pain and stated that he did not have this during his admission. Spoke with patient about having in home health services such as a nurse and the patient stated that he did not need any help in the home and he is able to take care of himself. This CM tried to explore other options of assistance for the patient in the home the patient declined and quickly thanked this CM for visiting but that services were not needed.

## 2014-01-26 ENCOUNTER — Inpatient Hospital Stay (HOSPITAL_COMMUNITY): Admission: RE | Admit: 2014-01-26 | Payer: Medicare Other | Source: Ambulatory Visit

## 2014-02-08 NOTE — Discharge Summary (Signed)
Physician Discharge Summary  George MassingJulius C Boone WRU:[id number] DOB: 07/14/1941 DOA: 01/19/2014  PCP: Dagoberto LigasPATEL,JAY K., MD  Admit date: 01/19/2014 Discharge date: 02/08/2014  Time spent: >35 minutes  Recommendations for Outpatient Follow-up:   AMA  Discharge Diagnoses:  Principal Problem:   Acute on chronic kidney disease, stage 3 Active Problems:   Weakness   Dehydration   Hyponatremia   Bronchitis   CAD (coronary artery disease)   H/O heart transplant   Hypertension   Hypotension   UTI (urinary tract infection)   Iron deficiency anemia   Discharge Condition: AMA  Diet recommendation:   Filed Weights   01/19/14 1815  Weight: 48.535 kg (107 lb)    History of present illness:  73 y/o male with hx of coronary artery disease, 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 Studies not automatically included, echos, thoracentesis, etc; not x-rays)  Consultations:  none  Discharge Exam: Filed Vitals:   01/20/14 1005  BP: 93/65  Pulse: 102  Temp: 98 F (36.7 C)  Resp: 20    General: alert Cardiovascular: s1,s2 rrr Respiratory: CTA BL  Discharge Instructions     Medication List    ASK your doctor about these medications       carvedilol 12.5 MG tablet  Commonly known as:  COREG  Take 12.5 mg by mouth 2 (two) times daily with a meal.     docusate sodium 100 MG capsule  Commonly known as:  COLACE  Take 100 mg by mouth daily.     multivitamin with minerals Tabs tablet  Take 1 tablet by mouth  daily.     mycophenolate 500 MG tablet  Commonly known as:  CELLCEPT  Take 500 mg by mouth 2 (two) times daily.     pravastatin 20 MG tablet  Commonly known as:  PRAVACHOL  Take 20 mg by mouth daily.     ranitidine 150 MG tablet  Commonly known as:  ZANTAC  Take 150 mg by mouth 2 (two) times daily.     sirolimus 2 MG tablet  Commonly known as:  RAPAMUNE  Take 2 mg by mouth daily.     tolterodine 2 MG 24 hr capsule  Commonly known as:  DETROL LA  Take 2 mg by mouth daily.       No Known Allergies    The results of significant diagnostics from this hospitalization (including imaging, microbiology, ancillary and laboratory) are listed below for reference.    Significant Diagnostic Studies: Dg Chest 2 View  01/19/2014   CLINICAL DATA:  Cough and weakness.  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 Cardiac silhouette remains mildly enlarged. Lungs are slightly better inflated than on the prior study. There is mild peribronchial thickening with streaky perihilar opacity bilaterally. No confluent airspace opacity, overt edema, pleural effusion, or pneumothorax is identified. No acute osseous abnormality is identified.  IMPRESSION: Findings suggestive of bronchitis.   Electronically Signed  By: Sebastian AcheAllen  Grady   On: 01/19/2014 14:31    Microbiology: No results found for this or any previous visit (from the past 240 hour(s)).   Labs: Basic Metabolic Panel: No results found for this basename: NA, K, CL, CO2, GLUCOSE, BUN, CREATININE, CALCIUM, MG, PHOS,  in the last 168 hours Liver Function Tests: No results found for this basename: AST, ALT, ALKPHOS, BILITOT, PROT, ALBUMIN,  in the last 168 hours No results found for this basename: LIPASE, AMYLASE,  in the last 168 hours No results found for this basename: AMMONIA,  in the last 168 hours CBC: No results found for this basename: WBC, NEUTROABS, HGB, HCT, MCV, PLT,  in the  last 168 hours Cardiac Enzymes: No results found for this basename: CKTOTAL, CKMB, CKMBINDEX, TROPONINI,  in the last 168 hours BNP: BNP (last 3 results) No results found for this basename: PROBNP,  in the last 8760 hours CBG: No results found for this basename: GLUCAP,  in the last 168 hours     Signed:  Esperanza SheetsUlugbek N Armelia Penton  [facility] 02/08/2014, 2:06 PM

## 2014-03-23 DEATH — deceased

## 2014-09-10 ENCOUNTER — Other Ambulatory Visit: Payer: Self-pay | Admitting: Nurse Practitioner
